# Patient Record
Sex: Female | Born: 1955 | Race: White | Hispanic: No | Marital: Married | Smoking: Never smoker
Health system: Southern US, Community
[De-identification: ages and names within clinical notes are randomized; demographics above are authoritative.]

## PROBLEM LIST (undated history)

## (undated) DIAGNOSIS — J309 Allergic rhinitis, unspecified: Secondary | ICD-10-CM

## (undated) DIAGNOSIS — E559 Vitamin D deficiency, unspecified: Secondary | ICD-10-CM

## (undated) DIAGNOSIS — C801 Malignant (primary) neoplasm, unspecified: Secondary | ICD-10-CM

## (undated) DIAGNOSIS — E119 Type 2 diabetes mellitus without complications: Secondary | ICD-10-CM

## (undated) HISTORY — DX: Allergic rhinitis, unspecified: J30.9

## (undated) HISTORY — DX: Type 2 diabetes mellitus without complications: E11.9

## (undated) HISTORY — DX: Vitamin D deficiency, unspecified: E55.9

---

## 1988-11-18 HISTORY — PX: TUBAL LIGATION: SHX77

## 2002-02-13 ENCOUNTER — Emergency Department (HOSPITAL_COMMUNITY): Admission: EM | Admit: 2002-02-13 | Discharge: 2002-02-13 | Payer: Self-pay | Admitting: Emergency Medicine

## 2004-07-05 ENCOUNTER — Ambulatory Visit (HOSPITAL_COMMUNITY): Admission: RE | Admit: 2004-07-05 | Discharge: 2004-07-05 | Payer: Self-pay | Admitting: Internal Medicine

## 2005-07-08 ENCOUNTER — Ambulatory Visit (HOSPITAL_COMMUNITY): Admission: RE | Admit: 2005-07-08 | Discharge: 2005-07-08 | Payer: Self-pay | Admitting: Obstetrics and Gynecology

## 2006-07-10 ENCOUNTER — Ambulatory Visit (HOSPITAL_COMMUNITY): Admission: RE | Admit: 2006-07-10 | Discharge: 2006-07-10 | Payer: Self-pay | Admitting: Obstetrics and Gynecology

## 2007-07-13 ENCOUNTER — Ambulatory Visit (HOSPITAL_COMMUNITY): Admission: RE | Admit: 2007-07-13 | Discharge: 2007-07-13 | Payer: Self-pay | Admitting: Obstetrics and Gynecology

## 2007-07-28 ENCOUNTER — Other Ambulatory Visit: Admission: RE | Admit: 2007-07-28 | Discharge: 2007-07-28 | Payer: Self-pay | Admitting: Obstetrics and Gynecology

## 2008-09-01 ENCOUNTER — Ambulatory Visit (HOSPITAL_COMMUNITY): Admission: RE | Admit: 2008-09-01 | Discharge: 2008-09-01 | Payer: Self-pay | Admitting: Obstetrics and Gynecology

## 2010-01-11 ENCOUNTER — Ambulatory Visit: Payer: Self-pay | Admitting: Internal Medicine

## 2010-01-11 DIAGNOSIS — R05 Cough: Secondary | ICD-10-CM

## 2010-01-11 DIAGNOSIS — J309 Allergic rhinitis, unspecified: Secondary | ICD-10-CM | POA: Insufficient documentation

## 2010-12-18 NOTE — Assessment & Plan Note (Signed)
Summary: Pulmonary/ new pt eval   Visit Type:  Initial Consult Copy to:  Self Primary Provider/Referring Provider:  Dr Sherwood Gambler  CC:  Cough.  History of Present Illness: 88 yowf never smoker with h/o mild springtime rhinitis with occ sudafed adequate to control and once a year or so get head cold down to chest and need  to go in for ov with zpak or biaxin and occ a shot with complete resolution w/in a week.  Does have chronic drainage issue that 's not that bad and not seasonal but "drives her husband crazy", doesn't really bother her.  January 11, 2010 cc acute onset x 1 week of watery rhinitis assoc  with nasal congestion seen by primary 2/19 and got shot and biaxin and feeling better and back to work 2/22.  Pt denies any significant sore throat, dysphagia, itching, sneezing,  fever, chills, sweats, unintended wt loss, pleuritic or exertional cp, hempoptysis, change in activity tolerance  orthopnea pnd or leg swelling.  Pt also denies any obvious fluctuation in symptoms with weather or environmental change or other alleviating or aggravating factors.         Allergies (verified): 1)  ! Keflex 2)  ! 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  The patient denies shortness of breath with activity, shortness of breath at rest, non-productive cough, coughing up blood, chest pain, irregular heartbeats, acid heartburn, indigestion, loss of appetite, weight change, abdominal pain, difficulty swallowing, sore throat, tooth/dental problems, headaches, itching, ear ache, anxiety, depression, hand/feet swelling, joint stiffness or pain,  rash, and fever.    Vital Signs:  Patient profile:   55 year old female Height:      65 inches Weight:      225.50 pounds BMI:     37.66 O2 Sat:      96 % on Room air Temp:     98.5 degrees F oral Pulse rate:   88 / minute BP sitting:   136 / 72  (left arm) Cuff size:   large  Vitals Entered By: Vernie Murders (January 11, 2010 2:17 PM)  O2 Flow:  Room air  Physical Exam  Additional Exam:  amb wf slt hoarse with occ throat clearing wt 225 January 11, 2010 HEENT: nl dentition, turbinates, and orophanx. Nl external ear canals without cough reflex NECK :  without JVD/Nodes/TM/ nl carotid upstrokes bilaterally LUNGS: no acc muscle use, clear to A and P bilaterally without cough on insp or exp maneuvers CV:  RRR  no s3 or murmur or increase in P2, no edema  ABD:  soft and nontender with nl excursion in the supine position. No bruits or organomegaly, bowel sounds nl MS:  warm without deformities, calf tenderness, cyanosis or clubbing SKIN: warm and dry without lesions   NEURO:  alert, approp, no deficits     CXR  Procedure date:  01/11/2010  Findings:      Comparison: None   Findings: Heart and mediastinal contours are within normal limits. No focal opacities or effusions.  No acute bony abnormality.   IMPRESSION: No active disease.  Impression & Recommendations:  Problem # 1:  COUGH (ICD-786.2) Upper airway  cough syndrome, so named because it's frequently impossible to sort out how much is LPR vs CR/sinusitis with freq throat clearing generating secondary extra esophageal GERD from wide swings in gastric pressure that occur with throat clearing, promoting self use of mint and menthol lozenges that reduce the lower esophageal sphincter tone and exacerbate the problem further.  These symptoms are easily confused with asthma/copd by even experienced pulmonogists because they overlap so much. These are the same pts who not infrequently have failed to tolerate ace inhibitors,   dry powder inhalers or biphosphonates or report having reflux symptoms that don't respond to standard doses of PPI  Explained natural h/o uri and why it's necessary in patients at risk to rx short term with PPI to reduce risk of evolving cyclical cough triggered by epithelial injury and a heightened sensitivty to the effects of any upper airway irritants,  most importantly acid - related    See instructions for specific recommendations   Problem # 2:  ALLERGIC RHINITIS (ICD-477.9) try as needed chlortrimeton   Other Orders: T-2 View CXR (71020TC) New Patient Level IV (04540)  Patient Instructions: 1)  You have different triggers (viral infection and allergies) that make you make clear your throat or cough and that trigger secondary reflux which may make the problem worse 2)  Acid reflux is a leading suspect here and needs to be eliminated  completely before considering additional studies or treatment options. To suppress this maximally, take prilosec Take  one 30-60 min before first meal of the day and pepcid 20 mg (otc) at bedtime plus diet measures as listed.  3)  if still have the urge to cough or clear throat best best chlortrimeton 4 mg one 6 hours as needed 4)  if not 100% satisfied you need to be seen by our allergist Dr Maple Hudson 5)  GERD (REFLUX)  is a common cause of respiratory symptoms. It commonly presents without heartburn and can be treated with medication, but also with lifestyle changes including avoidance of late meals, excessive alcohol, smoking cessation, and avoid fatty foods, chocolate, peppermint, colas, red wine, and acidic juices such as orange juice. NO MINT OR MENTHOL PRODUCTS SO NO COUGH DROPS  6)  USE SUGARLESS CANDY INSTEAD (jolley ranchers)  7)  NO OIL BASED VITAMINS

## 2012-09-23 ENCOUNTER — Ambulatory Visit (INDEPENDENT_AMBULATORY_CARE_PROVIDER_SITE_OTHER): Payer: Managed Care, Other (non HMO) | Admitting: Internal Medicine

## 2012-09-23 ENCOUNTER — Encounter: Payer: Self-pay | Admitting: Internal Medicine

## 2012-09-23 VITALS — BP 114/82 | HR 94 | Temp 97.9°F | Ht 64.0 in | Wt 233.0 lb

## 2012-09-23 DIAGNOSIS — R05 Cough: Secondary | ICD-10-CM

## 2012-09-23 DIAGNOSIS — R059 Cough, unspecified: Secondary | ICD-10-CM

## 2012-09-23 MED ORDER — TRAMADOL HCL 50 MG PO TABS: ORAL_TABLET | ORAL | Status: DC

## 2012-09-23 NOTE — Progress Notes (Signed)
Subjective:     Patient ID: Molly Weeks, female   DOB: 04/30/1956 MRN: [id number]  HPI  55yowf never smoker with h/o mild springtime rhinitis with occ sudafed adequate to control and once a year or so get head cold down to chest and need to go in for ov with zpak or biaxin and occ a shot with complete resolution w/in a week. Does have chronic drainage issue that 's not that bad and not seasonal but "drives her husband crazy", doesn't really bother her.   January 11, 2010 cc acute onset x 1 week of watery rhinitis assoc with nasal congestion seen by primary 2/19 and got shot and biaxin and feeling better and back to work 2/22.  rec 1) You have different triggers (viral infection and allergies) that make you make clear your throat or cough and that trigger secondary reflux which may make the problem worse  2) take prilosec Take one 30-60 min before first meal of the day and pepcid 20 mg (otc) at bedtime plus diet measures as listed.  3) if still have the urge to cough or clear throat best best chlortrimeton 4 mg one 6 hours as needed  4) if not 100% satisfied you need to be seen by our allergist Dr Molly Weeks  5) GERD diet reviewed > did not require f/u   09/23/2012 f/u ov/Molly Weeks cc persistent  throat clearing for year intially indolent onset but much worse since when 3 days prior to OV  > greenish mucus then turned clear seen by Molly Weeks [facility] 11/5 rx zpak and albuterol and shot and feeling better prior to OV.  Previously had not followed the gerd rx rec in 2011.  No obvious daytime variabilty or assoc chronic sob  or cp or chest tightness, subjective wheeze overt sinus or hb symptoms. No unusual exp hx    Sleeping ok without nocturnal  or early am exacerbation  of respiratory  c/o's or need for noct saba. Also denies any obvious fluctuation of symptoms with weather or environmental changes or other aggravating or alleviating factors except as outlined above   ROS  The following are not active complaints  unless bolded sore throat, dysphagia, dental problems, itching, sneezing,  nasal congestion or excess/ purulent secretions, ear ache,   fever, chills, sweats, unintended wt loss, pleuritic or exertional cp, hemoptysis,  orthopnea pnd or leg swelling, presyncope, palpitations, heartburn, abdominal pain, anorexia, nausea, vomiting, diarrhea  or change in bowel or urinary habits, change in stools or urine, dysuria,hematuria,  rash, arthralgias, visual complaints, headache, numbness weakness or ataxia or problems with walking or coordination,  change in mood/affect or memory.         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 Nl external ear canals without cough reflex  NECK : without JVD/Nodes/TM/ nl carotid upstrokes bilaterally  LUNGS: no acc muscle use, clear to A and P bilaterally without cough on insp or exp maneuvers  CV: RRR no s3 or murmur or increase in P2, no edema  ABD: soft and nontender with nl excursion in  the supine position. No bruits or organomegaly, bowel sounds nl  MS: warm without deformities, calf tenderness, cyanosis or clubbing  SKIN: warm and dry without lesions  NEURO: alert, approp, no deficits      Assessment:          Plan:

## 2012-09-23 NOTE — Patient Instructions (Addendum)
Finish the Zpak and only use albuterol if short of breath  Take delsym two tsp every 12 hours and supplement if needed with  tramadol 50 mg up to 2 every 4 hours to suppress the urge to cough. Swallowing water or using ice chips/non mint and menthol containing candies (such as lifesavers or sugarless jolly ranchers) are also effective.  You should rest your voice and avoid activities that you know make you cough.  Once you have eliminated the cough for 3 straight days try reducing the tramadol first,  then the delsym as tolerated.    Try prilosec 20mg   Take 30-60 min before first meal of the day and Pepcid 20 mg one bedtime until cough is completely gone for at least a week without the need for cough suppression  GERD (REFLUX)  is an extremely common cause of respiratory symptoms like yours, many times with no significant heartburn at all.    It can be treated with medication, but also with lifestyle changes including avoidance of late meals, excessive alcohol, smoking cessation, and avoid fatty foods, chocolate, peppermint, colas, red wine, and acidic juices such as orange juice.  NO MINT OR MENTHOL PRODUCTS SO NO COUGH DROPS  USE SUGARLESS CANDY INSTEAD (jolley ranchers or Stover's)  NO OIL BASED VITAMINS - use powdered substitutes.  If not improving next steps are sinus ct and referral to allergist  But you will need to return here to set these

## 2012-09-27 NOTE — Assessment & Plan Note (Addendum)
The most common causes of chronic cough in immunocompetent adults include the following: upper airway cough syndrome (UACS), previously referred to as postnasal drip syndrome (PNDS), which is caused by variety of rhinosinus conditions; (2) asthma; (3) GERD; (4) chronic bronchitis from cigarette smoking or other inhaled environmental irritants; (5) nonasthmatic eosinophilic bronchitis; and (6) bronchiectasis.   These conditions, singly or in combination, have accounted for up to 94% of the causes of chronic cough in prospective studies.   Other conditions have constituted no >6% of the causes in prospective studies These have included bronchogenic carcinoma, chronic interstitial pneumonia, sarcoidosis, left ventricular failure, ACEI-induced cough, and aspiration from a condition associated with pharyngeal dysfunction.   Most likely this is  Classic Upper airway cough syndrome, so named because it's frequently impossible to sort out how much is  CR/sinusitis with freq throat clearing (which can be related to primary GERD)   vs  causing  secondary (" extra esophageal")  GERD from wide swings in gastric pressure that occur with throat clearing, often  promoting self use of mint and menthol lozenges that reduce the lower esophageal sphincter tone and exacerbate the problem further in a cyclical fashion.   These are the same pts (now being labeled as having "irritable larynx syndrome" by some cough centers) who not infrequently have a history of having failed to tolerate ace inhibitors,  dry powder inhalers or biphosphonates or report having atypical reflux symptoms that don't respond to standard doses of PPI , and are easily confused as having aecopd or asthma flares by even experienced allergists/ pulmonologists.   Explained natural history of uri and why it's necessary in patients at risk to treat GERD aggressively  at least  short term   to reduce risk of evolving cyclical cough initially  triggered by  epithelial injury and a heightened sensitivty to the effects of any upper airway irritants,  most importantly acid - related.  That is, the more sensitive the epithelium damaged for virus, the more the cough, the more the secondary reflux (especially in those prone to reflux) the more the irritation of the sensitive mucosa and so on in a cyclical pattern.   If not satisfied needs to  Allergy evaluation next.

## 2013-12-23 ENCOUNTER — Other Ambulatory Visit: Payer: Self-pay | Admitting: Obstetrics and Gynecology

## 2013-12-23 DIAGNOSIS — Z139 Encounter for screening, unspecified: Secondary | ICD-10-CM

## 2013-12-27 ENCOUNTER — Ambulatory Visit (HOSPITAL_COMMUNITY)
Admission: RE | Admit: 2013-12-27 | Discharge: 2013-12-27 | Disposition: A | Discharge status: Home / Self Care | Payer: Managed Care, Other (non HMO) | Source: Ambulatory Visit | Attending: Obstetrics and Gynecology | Admitting: Obstetrics and Gynecology

## 2013-12-27 DIAGNOSIS — Z1231 Encounter for screening mammogram for malignant neoplasm of breast: Secondary | ICD-10-CM | POA: Insufficient documentation

## 2013-12-27 DIAGNOSIS — Z139 Encounter for screening, unspecified: Secondary | ICD-10-CM

## 2014-12-14 ENCOUNTER — Other Ambulatory Visit: Payer: Self-pay | Admitting: Obstetrics and Gynecology

## 2014-12-14 DIAGNOSIS — Z1231 Encounter for screening mammogram for malignant neoplasm of breast: Secondary | ICD-10-CM

## 2014-12-30 ENCOUNTER — Ambulatory Visit (HOSPITAL_COMMUNITY)
Admission: RE | Admit: 2014-12-30 | Discharge: 2014-12-30 | Disposition: A | Discharge status: Home / Self Care | Payer: Managed Care, Other (non HMO) | Source: Ambulatory Visit | Attending: Obstetrics and Gynecology | Admitting: Obstetrics and Gynecology

## 2014-12-30 DIAGNOSIS — Z1231 Encounter for screening mammogram for malignant neoplasm of breast: Secondary | ICD-10-CM

## 2015-12-05 ENCOUNTER — Other Ambulatory Visit: Payer: Self-pay | Admitting: Obstetrics and Gynecology

## 2015-12-05 DIAGNOSIS — Z1231 Encounter for screening mammogram for malignant neoplasm of breast: Secondary | ICD-10-CM

## 2016-01-01 ENCOUNTER — Ambulatory Visit (HOSPITAL_COMMUNITY)
Admission: RE | Admit: 2016-01-01 | Discharge: 2016-01-01 | Disposition: A | Discharge status: Home / Self Care | Payer: 59 | Source: Ambulatory Visit | Attending: Obstetrics and Gynecology | Admitting: Obstetrics and Gynecology

## 2016-01-01 DIAGNOSIS — Z1231 Encounter for screening mammogram for malignant neoplasm of breast: Secondary | ICD-10-CM | POA: Insufficient documentation

## 2016-11-29 ENCOUNTER — Other Ambulatory Visit: Payer: Self-pay | Admitting: Adult Health

## 2016-12-09 ENCOUNTER — Other Ambulatory Visit: Payer: Self-pay | Admitting: Obstetrics and Gynecology

## 2016-12-09 DIAGNOSIS — Z1231 Encounter for screening mammogram for malignant neoplasm of breast: Secondary | ICD-10-CM

## 2016-12-13 ENCOUNTER — Other Ambulatory Visit: Payer: Self-pay | Admitting: Adult Health

## 2017-01-01 ENCOUNTER — Emergency Department (HOSPITAL_COMMUNITY): Payer: 59

## 2017-01-01 ENCOUNTER — Emergency Department (HOSPITAL_COMMUNITY)
Admission: EM | Admit: 2017-01-01 | Discharge: 2017-01-01 | Disposition: A | Discharge status: Home / Self Care | Payer: 59 | Attending: Emergency Medicine | Admitting: Emergency Medicine

## 2017-01-01 ENCOUNTER — Encounter (HOSPITAL_COMMUNITY): Payer: Self-pay | Admitting: Emergency Medicine

## 2017-01-01 DIAGNOSIS — R05 Cough: Secondary | ICD-10-CM | POA: Diagnosis not present

## 2017-01-01 DIAGNOSIS — R509 Fever, unspecified: Secondary | ICD-10-CM | POA: Diagnosis present

## 2017-01-01 DIAGNOSIS — J111 Influenza due to unidentified influenza virus with other respiratory manifestations: Secondary | ICD-10-CM

## 2017-01-01 DIAGNOSIS — R69 Illness, unspecified: Secondary | ICD-10-CM

## 2017-01-01 LAB — BASIC METABOLIC PANEL
Anion gap: 8 (ref 5–15)
BUN: 13 mg/dL (ref 6–20)
CHLORIDE: 105 mmol/L (ref 101–111)
CO2: 25 mmol/L (ref 22–32)
CREATININE: 0.7 mg/dL (ref 0.44–1.00)
Calcium: 8.6 mg/dL — ABNORMAL LOW (ref 8.9–10.3)
GFR calc Af Amer: >60 mL/min (ref >60)
GFR calc non Af Amer: >60 mL/min (ref >60)
Glucose, Bld: 130 mg/dL — ABNORMAL HIGH (ref 65–99)
Potassium: 3.8 mmol/L (ref 3.5–5.1)
Sodium: 138 mmol/L (ref 135–145)

## 2017-01-01 LAB — CBC
HEMATOCRIT: 39.1 % (ref 36.0–46.0)
Hemoglobin: 13.5 g/dL (ref 12.0–15.0)
MCH: 31.9 pg (ref 26.0–34.0)
MCHC: 34.5 g/dL (ref 30.0–36.0)
MCV: 92.4 fL (ref 78.0–100.0)
Platelets: 247 10*3/uL (ref 150–400)
RBC: 4.23 MIL/uL (ref 3.87–5.11)
RDW: 12.2 % (ref 11.5–15.5)
WBC: 13.9 10*3/uL — ABNORMAL HIGH (ref 4.0–10.5)

## 2017-01-01 MED ORDER — OSELTAMIVIR PHOSPHATE 75 MG PO CAPS
75.0000 mg | ORAL_CAPSULE | Freq: Once | ORAL | Status: AC
  Administered 2017-01-01: 75 mg via ORAL
  Filled 2017-01-01: qty 1

## 2017-01-01 MED ORDER — OSELTAMIVIR PHOSPHATE 75 MG PO CAPS: 75.0000 mg | ORAL_CAPSULE | Freq: Two times a day (BID) | ORAL | 0 refills | Status: DC

## 2017-01-01 MED ORDER — ACETAMINOPHEN 325 MG PO TABS
650.0000 mg | ORAL_TABLET | Freq: Once | ORAL | Status: AC | PRN
  Administered 2017-01-01: 650 mg via ORAL
  Filled 2017-01-01: qty 2

## 2017-01-01 NOTE — Discharge Instructions (Signed)
You can consider taking the tamiflu for your influenza like illness.  Take tylenol or advil as needed for fever

## 2017-01-01 NOTE — ED Provider Notes (Signed)
[facility] DEPT Provider Note   CSN: [id number] Arrival date & time: 01/01/17  [place]     History   Chief Complaint Chief Complaint  Patient presents with  . Fever    HPI Molly Weeks is a 61 y.o. female.  HPI Patient presents to the emergency room with complaints of cough and fever that started yesterday. She has had cough productive of white mucus. She developed a fever today up to 102. She went into an urgent care this morning and was diagnosed with an upper respiratory infection. At the urgent care she had a negative flu test. At home patient started becoming tachycardic and she spiked the fever. Husband checked her pulse and it was over 100. He became concerned and brought her into the emergency room. Patient denies any vomiting or diarrhea. No chest pain or shortness of breath. Past Medical History:  Diagnosis Date  . Allergic rhinitis     Patient Active Problem List   Diagnosis Date Noted  . ALLERGIC RHINITIS 01/11/2010  . Cough 01/11/2010    Past Surgical History:  Procedure Laterality Date  . TUBAL LIGATION  1990    OB History    No data available       Home Medications    Prior to Admission medications   Medication Sig Start Date End Date Taking? Authorizing Provider  HYDROcodone-homatropine (HYCODAN) 5-1.5 MG/5ML syrup Take 5 mLs by mouth every 6 (six) hours as needed for cough.  01/01/17   Historical Provider, MD  oseltamivir (TAMIFLU) 75 MG capsule Take 1 capsule (75 mg total) by mouth 2 (two) times daily. 01/01/17   Dorie Rank, MD    Family History Family History  Problem Relation Age of Onset  . Asthma      multiple relatives  . Allergies      multiple relatives    Social History Social History  Substance Use Topics  . Smoking status: Never Smoker  . Smokeless tobacco: Not on file  . Alcohol use No     Allergies   Cephalexin and Moxifloxacin   Review of Systems Review of Systems  All other systems reviewed and are  negative.    Physical Exam Updated Vital Signs BP 120/72 (BP Location: Right Arm)   Pulse 96   Temp 99.9 F (37.7 C) (Oral)   Resp 18   Ht 5\' 5"  (1.651 m)   Wt 100.7 kg   SpO2 96%   BMI 36.94 kg/m   Physical Exam  Constitutional: She appears well-developed and well-nourished. No distress.  HENT:  Head: Normocephalic and atraumatic.  Right Ear: External ear normal.  Left Ear: External ear normal.  Eyes: Conjunctivae are normal. Right eye exhibits no discharge. Left eye exhibits no discharge. No scleral icterus.  Neck: Neck supple. No tracheal deviation present.  Cardiovascular: Normal rate and intact distal pulses.   Mild tachycardia  Pulmonary/Chest: Effort normal and breath sounds normal. No stridor. No respiratory distress. She has no wheezes. She has no rales.  Abdominal: Soft. Bowel sounds are normal. She exhibits no distension. There is no tenderness. There is no rebound and no guarding.  Musculoskeletal: She exhibits no edema or tenderness.  Neurological: She is alert. She has normal strength. No cranial nerve deficit (no facial droop, extraocular movements intact, no slurred speech) or sensory deficit. She exhibits normal muscle tone. She displays no seizure activity. Coordination normal.  Skin: Skin is warm and dry. No rash noted.  Psychiatric: She has a normal mood and affect.  Nursing note and vitals reviewed.    ED Treatments / Results  Labs (all labs ordered are listed, but only abnormal results are displayed) Labs Reviewed  CBC - Abnormal; Notable for the following:       Result Value   WBC 13.9 (*)    All other components within normal limits  BASIC METABOLIC PANEL - Abnormal; Notable for the following:    Glucose, Bld 130 (*)    Calcium 8.6 (*)    All other components within normal limits    EKG  EKG Interpretation None       Radiology Dg Chest 2 View  Result Date: 01/01/2017 CLINICAL DATA:  Cough EXAM: CHEST  2 VIEW COMPARISON:  01/11/2010  FINDINGS: The heart size and mediastinal contours are within normal limits. Both lungs are clear. The visualized skeletal structures are unremarkable. IMPRESSION: No active cardiopulmonary disease. Electronically Signed   By: Franchot Gallo M.D.   On: 01/01/2017 19:15    Procedures Procedures (including critical care time)  Medications Ordered in ED Medications  acetaminophen (TYLENOL) tablet 650 mg (650 mg Oral Given 01/01/17 1847)  oseltamivir (TAMIFLU) capsule 75 mg (75 mg Oral Given 01/01/17 2200)     Initial Impression / Assessment and Plan / ED Course  I have reviewed the triage vital signs and the nursing notes.  Pertinent labs & imaging results that were available during my care of the patient were reviewed by me and considered in my medical decision making (see chart for details).  Clinical Course as of Jan 02 1733  Wed Jan 01, 2017  2057 Fever decreased with treatment.  HR also decreased.  Labs show elevated WBC but otherwise normal.  Suspect influenza like illness. Pt is concerned about the possibility of influenza  [JK]    Clinical Course User Index [JK] Dorie Rank, MD    Pt appears well.  Family and pt are concerned about influenza and are requesting tamiflu.  Clinically sx are consistent with influenxza despite negative outpatient test suspect influenza.  Discussed risks and benefits of tamiflu.  Pt would prefer an RX  Final Clinical Impressions(s) / ED Diagnoses   Final diagnoses:  Influenza-like illness    New Prescriptions Discharge Medication List as of 01/01/2017  9:42 PM    START taking these medications   Details  oseltamivir (TAMIFLU) 75 MG capsule Take 1 capsule (75 mg total) by mouth 2 (two) times daily., Starting Wed 01/01/2017, Print         Dorie Rank, MD 01/02/17 [id number]

## 2017-01-01 NOTE — ED Triage Notes (Addendum)
Pt c/o cough and fever since yesterday. Pt reports cough is productive with white mucus. Pt seen at urgent care this am and dx with URI. Pt reports negative for flu at urgent care.

## 2017-01-03 ENCOUNTER — Other Ambulatory Visit: Payer: Self-pay | Admitting: Adult Health

## 2017-01-03 ENCOUNTER — Ambulatory Visit (HOSPITAL_COMMUNITY): Payer: 59

## 2017-01-06 ENCOUNTER — Ambulatory Visit (HOSPITAL_COMMUNITY): Payer: 59

## 2017-02-03 ENCOUNTER — Ambulatory Visit (HOSPITAL_COMMUNITY): Payer: 59

## 2017-03-17 ENCOUNTER — Ambulatory Visit (HOSPITAL_COMMUNITY)
Admission: RE | Admit: 2017-03-17 | Discharge: 2017-03-17 | Disposition: A | Discharge status: Home / Self Care | Payer: 59 | Source: Ambulatory Visit | Attending: Obstetrics and Gynecology | Admitting: Obstetrics and Gynecology

## 2017-03-17 DIAGNOSIS — Z1231 Encounter for screening mammogram for malignant neoplasm of breast: Secondary | ICD-10-CM | POA: Insufficient documentation

## 2017-04-01 ENCOUNTER — Encounter: Payer: Self-pay | Admitting: Adult Health

## 2017-04-01 ENCOUNTER — Ambulatory Visit (INDEPENDENT_AMBULATORY_CARE_PROVIDER_SITE_OTHER): Payer: 59 | Admitting: Adult Health

## 2017-04-01 ENCOUNTER — Other Ambulatory Visit (HOSPITAL_COMMUNITY)
Admission: RE | Admit: 2017-04-01 | Discharge: 2017-04-01 | Disposition: A | Discharge status: Home / Self Care | Payer: 59 | Source: Ambulatory Visit | Attending: Adult Health | Admitting: Adult Health

## 2017-04-01 VITALS — BP 138/86 | HR 62 | Ht 64.5 in | Wt 225.0 lb

## 2017-04-01 DIAGNOSIS — Z114 Encounter for screening for human immunodeficiency virus [HIV]: Secondary | ICD-10-CM

## 2017-04-01 DIAGNOSIS — Z1211 Encounter for screening for malignant neoplasm of colon: Secondary | ICD-10-CM | POA: Diagnosis not present

## 2017-04-01 DIAGNOSIS — Z13220 Encounter for screening for lipoid disorders: Secondary | ICD-10-CM

## 2017-04-01 DIAGNOSIS — Z01419 Encounter for gynecological examination (general) (routine) without abnormal findings: Secondary | ICD-10-CM

## 2017-04-01 DIAGNOSIS — Z1159 Encounter for screening for other viral diseases: Secondary | ICD-10-CM

## 2017-04-01 DIAGNOSIS — Z1329 Encounter for screening for other suspected endocrine disorder: Secondary | ICD-10-CM

## 2017-04-01 DIAGNOSIS — Z1321 Encounter for screening for nutritional disorder: Secondary | ICD-10-CM

## 2017-04-01 DIAGNOSIS — Z1212 Encounter for screening for malignant neoplasm of rectum: Secondary | ICD-10-CM

## 2017-04-01 DIAGNOSIS — Z131 Encounter for screening for diabetes mellitus: Secondary | ICD-10-CM

## 2017-04-01 LAB — HEMOCCULT GUIAC POC 1CARD (OFFICE): Fecal Occult Blood, POC: NEGATIVE

## 2017-04-01 NOTE — Progress Notes (Signed)
Patient ID: Molly Weeks, female   DOB: 03/09/1956, 61 y.o.   MRN: [id number] History of Present Illness:  Molly Weeks is a 61 year old white female, married, G1P1, she is PM and  in for well woman gyn exam and pap, she says it has  been years since last pap.She still works in Marketing executive at [employer].Her goa; for this year is to take better care of herself.  PCP is Dr [name].   Current Medications, Allergies, Past Medical History, Past Surgical History, Family History and Social History were reviewed in [facility] record.     Review of Systems:  Patient denies any headaches, hearing loss, fatigue, blurred vision, shortness of breath, chest pain, abdominal pain, problems with bowel movements, urination, or intercourse(not currently having sex, husband has health issues). No joint pain or mood swings.   Physical Exam:BP 138/86 (BP Location: Left Arm, Patient Position: Sitting, Cuff Size: Normal)   Pulse 62   Ht 5' 4.5" (1.638 m)   Wt 225 lb (102.1 kg)   BMI 38.02 kg/m  General:  Well developed, well nourished, no acute distress Skin:  Warm and dry Neck:  Midline trachea, normal thyroid, good ROM, no lymphadenopathy Lungs; Clear to auscultation bilaterally Breast:  No dominant palpable mass, retraction, or nipple discharge Cardiovascular: Regular rate and rhythm Abdomen:  Soft, non tender, no hepatosplenomegaly Pelvic:  External genitalia is normal in appearance, no lesions. Has skin tags inner thigh. The vagina is normal in appearance. Urethra has no lesions or masses. The cervix is smooth, pap with HPV performed.  Uterus is felt to be normal size, shape, and contour.  No adnexal masses or tenderness noted.Bladder is non tender, no masses felt. Rectal: Good sphincter tone, no polyps, or hemorrhoids felt.  Hemoccult negative. Extremities/musculoskeletal:  No swelling or varicosities noted, no clubbing or cyanosis Psych:  No mood changes, alert and cooperative,seems  happy PHQ 2 score 0.  Impression: 1. Pap smear, as part of routine gynecological examination   2. Screening for colorectal cancer   3. Screening cholesterol level   4. Screening for thyroid disorder   5. Screening for diabetes mellitus   6. Screening for HIV without presence of risk factors   7. Encounter for vitamin deficiency screening   8. Encounter for hepatitis C screening test for low risk patient       Plan: Check CBC,CMP,TSH and lipids,HIV, Hept C antibody, A1c and vitamin D Physical in 1 year, pap  in 3 if normal Mammogram yearly(normal 03/17/17) Colonoscopy advised and referral sent to Dr Gala Romney

## 2017-04-02 LAB — COMPREHENSIVE METABOLIC PANEL
A/G RATIO: 1.8 (ref 1.2–2.2)
ALBUMIN: 4.1 g/dL (ref 3.5–5.5)
ALT: 34 IU/L — AB (ref 0–32)
AST: 28 IU/L (ref 0–40)
Alkaline Phosphatase: 98 IU/L (ref 39–117)
BILIRUBIN TOTAL: 0.3 mg/dL (ref 0.0–1.2)
BUN/Creatinine Ratio: 20 (ref 9–23)
BUN: 12 mg/dL (ref 6–24)
CO2: 24 mmol/L (ref 18–29)
CREATININE: 0.59 mg/dL (ref 0.57–1.00)
Calcium: 9.1 mg/dL (ref 8.7–10.2)
Chloride: 102 mmol/L (ref 96–106)
GFR, EST AFRICAN AMERICAN: 117 mL/min/{1.73_m2} (ref >59)
GFR, EST NON AFRICAN AMERICAN: 101 mL/min/{1.73_m2} (ref >59)
GLOBULIN, TOTAL: 2.3 g/dL (ref 1.5–4.5)
Glucose: 131 mg/dL — ABNORMAL HIGH (ref 65–99)
POTASSIUM: 4.9 mmol/L (ref 3.5–5.2)
Sodium: 141 mmol/L (ref 134–144)
TOTAL PROTEIN: 6.4 g/dL (ref 6.0–8.5)

## 2017-04-02 LAB — CYTOLOGY - PAP
Diagnosis: NEGATIVE
HPV (WINDOPATH): NOT DETECTED

## 2017-04-02 LAB — LIPID PANEL
CHOL/HDL RATIO: 4.6 ratio — AB (ref 0.0–4.4)
CHOLESTEROL TOTAL: 209 mg/dL — AB (ref 100–199)
HDL: 45 mg/dL (ref >39)
LDL CALC: 135 mg/dL — AB (ref 0–99)
TRIGLYCERIDES: 145 mg/dL (ref 0–149)
VLDL Cholesterol Cal: 29 mg/dL (ref 5–40)

## 2017-04-02 LAB — CBC
HEMATOCRIT: 41 % (ref 34.0–46.6)
HEMOGLOBIN: 13.4 g/dL (ref 11.1–15.9)
MCH: 30.7 pg (ref 26.6–33.0)
MCHC: 32.7 g/dL (ref 31.5–35.7)
MCV: 94 fL (ref 79–97)
Platelets: 280 10*3/uL (ref 150–379)
RBC: 4.37 x10E6/uL (ref 3.77–5.28)
RDW: 13 % (ref 12.3–15.4)
WBC: 7.9 10*3/uL (ref 3.4–10.8)

## 2017-04-02 LAB — VITAMIN D 25 HYDROXY (VIT D DEFICIENCY, FRACTURES): Vit D, 25-Hydroxy: 13.1 ng/mL — ABNORMAL LOW (ref 30.0–100.0)

## 2017-04-02 LAB — TSH: TSH: 3.06 u[IU]/mL (ref 0.450–4.500)

## 2017-04-02 LAB — HEPATITIS C ANTIBODY: Hep C Virus Ab: <0.1 s/co ratio (ref 0.0–0.9)

## 2017-04-02 LAB — HEMOGLOBIN A1C
ESTIMATED AVERAGE GLUCOSE: 151 mg/dL
Hgb A1c MFr Bld: 6.9 % — ABNORMAL HIGH (ref 4.8–5.6)

## 2017-04-02 LAB — HIV ANTIBODY (ROUTINE TESTING W REFLEX): HIV SCREEN 4TH GENERATION: NONREACTIVE

## 2017-04-08 ENCOUNTER — Telehealth: Payer: Self-pay | Admitting: Adult Health

## 2017-04-08 ENCOUNTER — Encounter: Payer: Self-pay | Admitting: Adult Health

## 2017-04-08 DIAGNOSIS — E559 Vitamin D deficiency, unspecified: Secondary | ICD-10-CM | POA: Insufficient documentation

## 2017-04-08 DIAGNOSIS — E119 Type 2 diabetes mellitus without complications: Secondary | ICD-10-CM

## 2017-04-08 HISTORY — DX: Type 2 diabetes mellitus without complications: E11.9

## 2017-04-08 HISTORY — DX: Vitamin D deficiency, unspecified: E55.9

## 2017-04-08 NOTE — Telephone Encounter (Signed)
Pt aware of labs, referred to [facility], RD,CDE, for new diagnosis of diabetes, will try diet and exercise, and F/U with PCP for meds.Take vitam in D 5000 IU daily  Pt aware that pap negative for malignancy and HPV.

## 2017-06-09 ENCOUNTER — Encounter: Payer: 59 | Attending: Internal Medicine | Admitting: Nutrition

## 2017-06-09 ENCOUNTER — Encounter: Payer: Self-pay | Admitting: Nutrition

## 2017-06-09 VITALS — Ht 64.0 in | Wt 213.0 lb

## 2017-06-09 DIAGNOSIS — E119 Type 2 diabetes mellitus without complications: Secondary | ICD-10-CM | POA: Insufficient documentation

## 2017-06-09 DIAGNOSIS — IMO0002 Reserved for concepts with insufficient information to code with codable children: Secondary | ICD-10-CM

## 2017-06-09 DIAGNOSIS — E118 Type 2 diabetes mellitus with unspecified complications: Secondary | ICD-10-CM

## 2017-06-09 DIAGNOSIS — Z713 Dietary counseling and surveillance: Secondary | ICD-10-CM | POA: Insufficient documentation

## 2017-06-09 DIAGNOSIS — E669 Obesity, unspecified: Secondary | ICD-10-CM

## 2017-06-09 DIAGNOSIS — E1165 Type 2 diabetes mellitus with hyperglycemia: Secondary | ICD-10-CM

## 2017-06-09 NOTE — Patient Instructions (Signed)
Goals 1 Follow My Plate 2. Eat three meals on time  2-3 carb choices per meal 3. Increase fresh fruits and vegetables. 4. Drink water only 5. Cut out snacks unless veggies or nuts 6. Don't skip meals Lose 1 lb per week Get A1C 5.7% or less

## 2017-06-12 NOTE — Progress Notes (Signed)
Diabetes Self-Management Education  Visit Type: First/Initial  Appt. Start Time: 1600 Appt. End Time:1700 06/09/18   [name], identified by name and date of birth, is a 61 y.o. female with a diagnosis of Diabetes: Type 2. Just diagnosed from OBGYN. Wants to change eating habits, exercise, lose weight and control her DM with diet and exercise. Lab Results  Component Value Date   HGBA1C 6.9 (H) 04/01/2017     ASSESSMENT  Height 5\' 4"  (1.626 m), weight 213 lb (96.6 kg). Body mass index is 36.56 kg/m.      Diabetes Self-Management Education - 06/09/17 1555      Visit Information   Visit Type First/Initial     Initial Visit   Diabetes Type Type 2   Are you currently following a meal plan? No   Are you taking your medications as prescribed? Not on Medications   Date Diagnosed July 2018     Health Coping   How would you rate your overall health? Good     Psychosocial Assessment   Patient Belief/Attitude about Diabetes Motivated to manage diabetes   Self-care barriers None   Self-management support Doctor's office;Family   Other persons present Patient   Patient Concerns Nutrition/Meal planning;Healthy Lifestyle;Weight Control   Special Needs None   Preferred Learning Style Visual   Learning Readiness Change in progress   How often do you need to have someone help you when you read instructions, pamphlets, or other written materials from your doctor or pharmacy? 1 - Never   What is the last grade level you completed in school? 16     Pre-Education Assessment   Patient understands the diabetes disease and treatment process. Needs Instruction   Patient understands incorporating nutritional management into lifestyle. Needs Instruction   Patient undertands incorporating physical activity into lifestyle. Needs Instruction   Patient understands using medications safely. Needs Instruction   Patient understands monitoring blood glucose, interpreting and using results  Needs Instruction   Patient understands prevention, detection, and treatment of acute complications. Needs Instruction   Patient understands prevention, detection, and treatment of chronic complications. Needs Instruction   Patient understands how to develop strategies to address psychosocial issues. Needs Instruction   Patient understands how to develop strategies to promote health/change behavior. Needs Instruction     Complications   Last HgB A1C per patient/outside source 6.9 %   How often do you check your blood sugar? 0 times/day (not testing)   Have you had a dilated eye exam in the past 12 months? Yes   Have you had a dental exam in the past 12 months? Yes   Are you checking your feet? Yes   How many days per week are you checking your feet? 7     Dietary Intake   Breakfast Oatmeal with banana and rasins, water   Lunch 2 v8 juice, OR  toss salad with ff ranch dressing, wwater, chicken,    Snack (afternoon) yogurt   Programmer, multimedia [place] and toss salad with FF dressing,     Beverage(s) water     Exercise   Exercise Type ADL's      Individualized Plan for Diabetes Self-Management Training:   Learning Objective:  Patient will have a greater understanding of diabetes self-management. Patient education plan is to attend individual and/or group sessions per assessed needs and concerns.   Plan:   Patient Instructions  Goals 1 Follow My Plate 2. Eat three meals on time  2-3 carb choices per meal 3.  Increase fresh fruits and vegetables. 4. Drink water only 5. Cut out snacks unless veggies or nuts 6. Don't skip meals Lose 1 lb per week Get A1C 5.7% or less    Expected Outcomes:     Education material provided: Living Well with Diabetes, Food label handouts, A1C conversion sheet, Meal plan card, My Plate and Carbohydrate counting sheet  If problems or questions, patient to contact team via:  Phone and Email  Future DSME appointment:   1 month

## 2017-07-10 ENCOUNTER — Encounter: Payer: 59 | Attending: Internal Medicine | Admitting: Nutrition

## 2017-07-10 VITALS — Ht 64.0 in | Wt 205.6 lb

## 2017-07-10 DIAGNOSIS — E669 Obesity, unspecified: Secondary | ICD-10-CM

## 2017-07-10 DIAGNOSIS — E119 Type 2 diabetes mellitus without complications: Secondary | ICD-10-CM | POA: Diagnosis present

## 2017-07-10 DIAGNOSIS — Z713 Dietary counseling and surveillance: Secondary | ICD-10-CM | POA: Insufficient documentation

## 2017-07-10 NOTE — Patient Instructions (Signed)
Goals 1. Work on losing 2 lbs per month 2. Increase exercise routine and walk more during day. 3. Keep drinking water Get A1C down to 6% or less. Continue high fiber foods

## 2017-07-10 NOTE — Progress Notes (Signed)
Diabetes Self-Management Education  Visit Type:    Appt. Start Time: 1600 Appt. End Time:1630 06/09/18   [name], identified by name and date of birth, is a 61 y.o. female with a diagnosis of Diabetes:  Marland Kitchen Type 2. Lost 8 lbs since last visit 1 month ago. Lost 25 lbs in the last 3 months. She is now eating better balanced meals. She is following her meal plan and using her carb card. She has been walking 30 minutes a day.   Has been getting in a routine for meals much better.  FBS 100-102 mg/dl.  Eating a lot more fresh fruits and vegetables. She will see Dr. [name] in the next month and get her labs done to evaluate progress. She is not on any DM meds. Has made excellent progress with her eating habits and lifestyle.  Lab Results  Component Value Date   HGBA1C 6.9 (H) 04/01/2017   ASSESSMENT  Height 5\' 4"  (1.626 m), weight 205 lb 9.6 oz (93.3 kg). Body mass index is 35.29 kg/m.   B) Oatmeal, raisin or banana, cottage cheese,water L) chicken salad, 6 crackers, fruits, water D) Grilled Weeks, baked potato and toss salad with FF Dressing. Drinking water   Individualized Plan for Diabetes Self-Management Training:   Learning Objective:  Patient will have a greater understanding of diabetes self-management. Patient education plan is to attend individual and/or group sessions per assessed needs and concerns.   Plan:  Goals 1. Work on losing 2 lbs per month 2. Increase exercise routine and walk more during day. 3. Keep drinking water Get A1C down to 6% or less. Continue high fiber foods Expected Outcomes:   Further weight loss and lower blood sugars and increased self management of her diabetes.  Education material provided: Living Well with Diabetes, Food label handouts, A1C conversion sheet, Meal plan card, My Plate and Carbohydrate counting sheet  If problems or questions, patient to contact team via:  Phone and Email  Future DSME appointment:   3 months.

## 2017-10-13 ENCOUNTER — Encounter: Payer: 59 | Attending: Internal Medicine | Admitting: Nutrition

## 2017-10-13 ENCOUNTER — Encounter: Payer: Self-pay | Admitting: Nutrition

## 2017-10-13 VITALS — Ht 64.0 in | Wt 195.0 lb

## 2017-10-13 DIAGNOSIS — Z713 Dietary counseling and surveillance: Secondary | ICD-10-CM | POA: Diagnosis not present

## 2017-10-13 DIAGNOSIS — E669 Obesity, unspecified: Secondary | ICD-10-CM

## 2017-10-13 DIAGNOSIS — E119 Type 2 diabetes mellitus without complications: Secondary | ICD-10-CM | POA: Insufficient documentation

## 2017-10-13 NOTE — Patient Instructions (Signed)
Goals Keep up the great job!! Increase exercise as tolerated. Lose 2-3 lbs per month

## 2017-10-13 NOTE — Progress Notes (Signed)
Diabetes Self-Management Education  Visit Type:    Follow up  Appt. Start Time: 1600 Appt. End Time:1630 10/13/17  [name], identified by name and date of birth, is a 61 y.o. female with a diagnosis of Diabetes:  Marland Kitchen Type 2. Lost 10 lbs since last visit 3 months ago. Has lost 30 lbs in the last 6 months. Not taking any DM meds and no longer testing blood sugars. Not on any medications for anything.  Starting to get back to walking.  She admits to being more mindful about what she is eating and avoiding snacks.  A1C 10/18 from Dr. [name] office was 5.2%.  ASSESSMENT  Height 5\' 4"  (1.626 m), weight 195 lb (88.5 kg). Body mass index is 33.47 kg/m.   B) Oatmeal, raisin or banana, cottage cheese,water L) chicken salad, 6 crackers, fruits, water D) Grilled Weeks, baked potato and toss salad with FF Dressing. Drinking water   Individualized Plan for Diabetes Self-Management Training:   Learning Objective:  Patient will have a greater understanding of diabetes self-management. Patient education plan is to attend individual and/or group sessions per assessed needs and concerns.    Goals Keep up the great job!! Increase exercise as tolerated. Lose 2-3 lbs per month  Expected Outcomes:   Further weight loss and lower blood sugars and increased self management of her diabetes.  Education material provided: Living Well with Diabetes, Food label handouts, A1C conversion sheet, Meal plan card, My Plate and Carbohydrate counting sheet  If problems or questions, patient to contact team via:  Phone and Email  Future DSME appointment:   1 year.

## 2018-01-29 ENCOUNTER — Emergency Department (INDEPENDENT_AMBULATORY_CARE_PROVIDER_SITE_OTHER)
Admission: EM | Admit: 2018-01-29 | Discharge: 2018-01-29 | Disposition: A | Discharge status: Home / Self Care | Payer: 59 | Source: Home / Self Care | Attending: Emergency Medicine | Admitting: Emergency Medicine

## 2018-01-29 ENCOUNTER — Other Ambulatory Visit: Payer: Self-pay

## 2018-01-29 ENCOUNTER — Encounter: Payer: Self-pay | Admitting: *Deleted

## 2018-01-29 ENCOUNTER — Emergency Department (INDEPENDENT_AMBULATORY_CARE_PROVIDER_SITE_OTHER): Payer: 59

## 2018-01-29 DIAGNOSIS — R05 Cough: Secondary | ICD-10-CM

## 2018-01-29 DIAGNOSIS — J209 Acute bronchitis, unspecified: Secondary | ICD-10-CM

## 2018-01-29 DIAGNOSIS — E860 Dehydration: Secondary | ICD-10-CM

## 2018-01-29 DIAGNOSIS — R509 Fever, unspecified: Secondary | ICD-10-CM | POA: Diagnosis not present

## 2018-01-29 DIAGNOSIS — J101 Influenza due to other identified influenza virus with other respiratory manifestations: Secondary | ICD-10-CM

## 2018-01-29 DIAGNOSIS — R058 Other specified cough: Secondary | ICD-10-CM

## 2018-01-29 LAB — POCT FASTING CBG KUC MANUAL ENTRY: POCT Glucose (KUC): 152 mg/dL — AB (ref 70–99)

## 2018-01-29 LAB — POCT INFLUENZA A/B
Influenza A, POC: POSITIVE — AB
Influenza B, POC: NEGATIVE

## 2018-01-29 MED ORDER — BENZONATATE 100 MG PO CAPS: 100.0000 mg | ORAL_CAPSULE | Freq: Three times a day (TID) | ORAL | 0 refills | Status: DC

## 2018-01-29 MED ORDER — IBUPROFEN 400 MG PO TABS
400.0000 mg | ORAL_TABLET | Freq: Once | ORAL | Status: AC
  Administered 2018-01-29: 400 mg via ORAL

## 2018-01-29 MED ORDER — SODIUM CHLORIDE 0.9 % IV SOLN
Freq: Once | INTRAVENOUS | Status: AC
  Administered 2018-01-29: 12:00:00 via INTRAVENOUS

## 2018-01-29 MED ORDER — BALOXAVIR MARBOXIL(80 MG DOSE) 2 X 40 MG PO TBPK: 80.0000 mg | ORAL_TABLET | Freq: Once | ORAL | 0 refills | Status: AC

## 2018-01-29 MED ORDER — CLARITHROMYCIN 500 MG PO TABS: ORAL_TABLET | ORAL | 0 refills | Status: DC

## 2018-01-29 MED ORDER — ONDANSETRON 4 MG PO TBDP: 4.0000 mg | ORAL_TABLET | Freq: Three times a day (TID) | ORAL | 0 refills | Status: DC | PRN

## 2018-01-29 NOTE — ED Triage Notes (Signed)
Patient c/o productive cough with colored sputum and colored nasal discharge x yesterday. T-max last night of 101.6. Taken Advil last night.

## 2018-01-29 NOTE — Discharge Instructions (Signed)
Your flu test was positive for influenza A. Chest x-ray, no pneumonia.  But you also have bronchitis . For the bronchitis, we're prescribing Biaxin twice a day for 10 days. For influenza A, prescription for Xofluza, which is 1 dose treatment for the flu. (Please use card given which can lower the cost, otherwise it is expensive, even for 1 dose !) Zofran if needed for nausea. Tessalon Perles prescribed for cough. Here in urgent care, we were also dehydrated, and we gave you 1 L of IV fluids and you improved.  Be sure to drink plenty of fluids at home. Please see attached instruction sheets. If any severely worsening symptoms, call 911 and go to emergency room

## 2018-01-29 NOTE — ED Provider Notes (Signed)
[name]    CSN: [id number] Arrival date & time: 01/29/18  1031     History   Chief Complaint Chief Complaint  Patient presents with  . Cough  . Nasal Congestion   Here with husband HPI Molly Weeks is a 62 y.o. female.   HPI   HPI : Acute flu symptoms for 1 day. Fever to 101.1 with chills, sweats,  fatigue, headache.  Denies myalgias.   Symptoms are progressively worsening, despite trying OTC fever reducing medicine and rest and fluids. Has decreased appetite, but tolerating some liquids by mouth. No history of recent tick bite. Then, last night, started with worsening congested cough, productive of purulent yellow-green sputum, with chest congestion.  No definite shortness of breath.  Review of Systems: Positive for fatigue, mild nasal congestion, mild sore throat, mild swollen anterior neck glands, Negative for acute vision changes, stiff neck, focal weakness, syncope, seizures, respiratory distress, vomiting, diarrhea, GU symptoms, new rash.  Past Medical History:  Diagnosis Date  . Allergic rhinitis   . Diabetes mellitus without complication ([place]) 0/25/4270   Referred to RD/CDE  . Vitamin D deficiency 04/08/2017    Patient Active Problem List   Diagnosis Date Noted  . Vitamin D deficiency 04/08/2017  . Diabetes mellitus without complication ([place]) 62/37/6283  . ALLERGIC RHINITIS 01/11/2010  . Cough 01/11/2010    Past Surgical History:  Procedure Laterality Date  . TUBAL LIGATION  1990    OB History    Gravida Para Term Preterm AB Living   1 1       1    SAB TAB Ectopic Multiple Live Births                   Home Medications    Prior to Admission medications   Medication Sig Start Date End Date Taking? Authorizing Provider  Nutritional Supplements (VITAMIN D MAINTENANCE PO) Take by mouth.   Yes [provider]  Baloxavir Marboxil 80 MG Dose (XOFLUZA) 40 (2) MG TBPK Take 80 mg by mouth once for 1 dose. 01/29/18 01/29/18   Jacqulyn Cane, MD  benzonatate (TESSALON) 100 MG capsule Take 1 capsule (100 mg total) by mouth every 8 (eight) hours. As needed for cough 01/29/18   Jacqulyn Cane, MD  clarithromycin (BIAXIN) 500 MG tablet Take 1 twice a day for 10 days. 01/29/18   Jacqulyn Cane, MD  ondansetron (ZOFRAN-ODT) 4 MG disintegrating tablet Take 1 tablet (4 mg total) by mouth every 8 (eight) hours as needed for nausea or vomiting. 01/29/18   Jacqulyn Cane, MD    Family History Family History  Problem Relation Age of Onset  . Asthma Unknown        multiple relatives  . Allergies Unknown        multiple relatives  . Diabetes Paternal Grandfather   . Hypertension Paternal Grandfather   . Heart disease Paternal Grandfather   . Diabetes Paternal Grandmother   . Hypertension Paternal Grandmother   . Heart disease Paternal Grandmother   . Diabetes Maternal Grandmother   . Hypertension Maternal Grandmother   . Heart disease Maternal Grandmother   . Diabetes Maternal Grandfather   . Hypertension Maternal Grandfather   . Heart disease Maternal Grandfather   . Diabetes Father   . Hypertension Father   . Heart disease Father   . Hypertension Mother   . Diabetes Mother   . Heart disease Mother   . Cancer Brother     Social History Social History  Tobacco Use  . Smoking status: Never Smoker  . Smokeless tobacco: Never Used  Substance Use Topics  . Alcohol use: No  . Drug use: No     Allergies   Cephalexin; Moxifloxacin; and Tamiflu [oseltamivir phosphate] I questioned patient further about Tamiflu, she states she had severe nausea right after taking Tamiflu in 2018  Review of Systems Review of Systems  All other systems reviewed and are negative.    Physical Exam Triage Vital Signs ED Triage Vitals [01/29/18 1052]  Enc Vitals Group     BP 132/80     Pulse Rate 99     Resp 14     Temp (!) 101.1 F (38.4 C)     Temp Source Oral     SpO2 98 %     Weight 194 lb (88 kg)     Height      Head  Circumference      Peak Flow      Pain Score 0     Pain Loc      Pain Edu?      Excl. in [place]?    No data found.  Updated Vital Signs BP 109/74   Pulse 88   Temp 99.4 F (37.4 C) (Oral)   Resp 14   Wt 194 lb (88 kg)   SpO2 98%   BMI 33.30 kg/m    Physical Exam  Constitutional: She appears well-developed and well-nourished.  Non-toxic appearance. She appears ill (very fatigued, but no cardiorespiratory distress). No distress.  Occasional hacking cough noted  HENT:  Head: Normocephalic and atraumatic.  Right Ear: Tympanic membrane and external ear normal.  Left Ear: Tympanic membrane and external ear normal.  Nose: Rhinorrhea present.  Mouth/Throat: Posterior oropharyngeal erythema (Minimal injection) present. No oropharyngeal exudate.  Oral mucous membranes slightly dry, but some moisture  Eyes: Conjunctivae are normal. Right eye exhibits no discharge. Left eye exhibits no discharge. No scleral icterus.  Neck: Neck supple.  Cardiovascular: Normal rate, regular rhythm and normal heart sounds.  Pulmonary/Chest: No stridor. No respiratory distress. She has wheezes. She has rales.  Diffuse rhonchi in all lung fields. Breath sounds equal bilaterally. Minimal scattered late expiratory wheezes.  Good air movement bilaterally. mild right basilar crackles.  Abdominal: Soft. She exhibits no distension. There is no tenderness.  Musculoskeletal: She exhibits no edema.  Legs: No cyanosis, clubbing, or edema.  No tenderness or cords  Lymphadenopathy:    She has cervical adenopathy (mild shoddy anterior cervical nodes).  Neurological: She is alert.  Skin: Skin is warm and intact. No rash noted. She is diaphoretic.  Psychiatric: She has a normal mood and affect.  Nursing note and vitals reviewed.    UC Treatments / Results  Labs (all labs ordered are listed, but only abnormal results are displayed) Labs Reviewed  POCT INFLUENZA A/B - Abnormal; Notable for the following components:       Result Value   Influenza A, POC Positive (*)    All other components within normal limits  POCT FASTING CBG [facility] MANUAL ENTRY - Abnormal; Notable for the following components:   POCT Glucose ([facility]) 152 (*)    All other components within normal limits    EKG  EKG Interpretation None       Radiology Dg Chest 2 View  Result Date: 01/29/2018 CLINICAL DATA:  Cough, congestion and low-grade fever since last night. EXAM: CHEST - 2 VIEW COMPARISON:  PA and lateral chest 01/01/2017 and 01/11/2010. FINDINGS: The lungs  are clear. No consolidative process, pneumothorax or effusion. Heart size is normal. No acute bony abnormality. IMPRESSION: No acute disease. Electronically Signed   By: Inge Rise M.D.   On: 01/29/2018 11:39    Procedures Procedures (including critical care time)  Medications Ordered in UC Medications  ibuprofen (ADVIL,MOTRIN) tablet 400 mg (400 mg Oral Given 01/29/18 1048)  0.9 %  sodium chloride infusion ( Intravenous Stopped 01/29/18 1315)     Initial Impression / Assessment and Plan / UC Course  I have reviewed the triage vital signs and the nursing notes.  Pertinent labs & imaging results that were available during my care of the patient were reviewed by me and considered in my medical decision making (see chart for details).  Clinical Course as of Jan 29 1405  Thu Jan 29, 2018  1120 Ibuprofen given stat for fever. Patient evaluated, history and physical performed. I strongly suspect influenza, but also suspect acute bronchitis or pneumonia. Nasal swab for acute influenza test. Chest x-ray ordered.  [DM]  1206 While discussing treatment options with patient and husband, patient appeared stressed with the discussion, she felt very warm and flushed, and then had a brief syncopal event, mildly diaphoretic.  She was put in Trendelenburg, cool compresses, and sips of ginger ale and within seconds, she became conscious, responsive to verbal commands.  Blood  pressure 118/75, pulse 86, regular oxygen saturation 97% on room air, fingerstick blood sugar 155.  Heart: Regular rate and rhythm. Likely vasovagal event associated with dehydration from influenza.  I ordered normal saline IV here in urgent care.  [DM]  5176 Nonfocal neurologic exam.  Cranial nerves intact.  Moves all 4 extremities  [DM]    Clinical Course User Index [DM] Jacqulyn Cane, MD   Influenza test positive for influenza A. Chest x-ray: No infiltrate, although clinically I am suspicious for an early pneumonic process based on auscultation of lungs     Patient was given IV fluids, 1 L of normal saline IV over 2 hours.  She tolerated this well. She felt much better afterward and energy level improved.  Rechecked BP 115/80.  Pulse 80, regular  At time of discharge from urgent care, she was feeling much better and was alert and was able to stand and walk to wheelchair, and we transported her in wheelchair to car, husband to drive her home. Prior to discharge, treatment options further discussed at length.  With patient and husband.  For influenza A, I initially recommended Tamiflu, but patient states she had severe nausea last year after taking Tamiflu and although I offered to prescribe Zofran with the Tamiflu, she declined that option.  -Therefore, prescribed Baloxavir Marboxil 40 mg.  Number 2.  1 p.o. Stat. (I researched that this is FDA approved for influenza)  -Biaxin 500 BID to cover bacterial bronchitis.  She clinically may have an early pneumonia based on auscultation, although chest x-ray within normal limits. -Zofran prescribed as needed nausea -Tessalon Perles at her request as needed cough    Final diagnoses:  Fever and chills  Cough productive of purulent sputum  Influenza A  Dehydration  Acute bronchitis, unspecified organism    ED Discharge Orders        Ordered    clarithromycin (BIAXIN) 500 MG tablet     01/29/18 1317    ondansetron (ZOFRAN-ODT) 4 MG  disintegrating tablet  Every 8 hours PRN     01/29/18 1317    Baloxavir Marboxil 80 MG Dose (XOFLUZA) 40 (2)  MG TBPK   Once    Comments:  Prescription card given, hopefully to lower cost   01/29/18 1317    benzonatate (TESSALON) 100 MG capsule  Every 8 hours     01/29/18 1317     An After Visit Summary was printed and given to the patient and husband:  Your flu test was positive for influenza A. Chest x-ray, no pneumonia.  But you also have bronchitis . For the bronchitis, we're prescribing Biaxin twice a day for 10 days. For influenza A, prescription for Xofluza, which is 1 dose treatment for the flu. (Please use card given which can lower the cost, otherwise it is expensive, even for 1 dose !) Zofran if needed for nausea. Tessalon Perles prescribed for cough. Here in urgent care, we were also dehydrated, and we gave you 1 L of IV fluids and you improved.  Be sure to drink plenty of fluids at home. Please see attached instruction sheets. If any severely worsening symptoms, call 911 and go to emergency room  Controlled Substance Prescriptions [place] Controlled Substance Registry consulted? Not Applicable   Jacqulyn Cane, MD 01/29/18 1414

## 2018-01-29 NOTE — ED Notes (Signed)
After reviewing x-ray and flu results and treatment plan with Dr. Burnett Harry, patient's husband came out and reporting she was losing consciousness. Patient was non-responsive with eyes open and pupils dilated bilaterally, pale and diaphoretic. For <3 seconds she was not breathing. After laying her flat and a sternal rub she snorted then started breathing. She shortly slowly regained consciousness with pupils returning to normal. Fingerstick glucose 152, BP, 118/75.

## 2018-01-30 ENCOUNTER — Telehealth: Payer: Self-pay | Admitting: Emergency Medicine

## 2018-02-06 ENCOUNTER — Other Ambulatory Visit: Payer: Self-pay | Admitting: Obstetrics and Gynecology

## 2018-02-06 DIAGNOSIS — Z1231 Encounter for screening mammogram for malignant neoplasm of breast: Secondary | ICD-10-CM

## 2018-03-23 ENCOUNTER — Encounter (HOSPITAL_COMMUNITY): Payer: Self-pay

## 2018-03-23 ENCOUNTER — Ambulatory Visit (HOSPITAL_COMMUNITY)
Admission: RE | Admit: 2018-03-23 | Discharge: 2018-03-23 | Disposition: A | Discharge status: Home / Self Care | Payer: 59 | Source: Ambulatory Visit | Attending: Obstetrics and Gynecology | Admitting: Obstetrics and Gynecology

## 2018-03-23 DIAGNOSIS — Z1231 Encounter for screening mammogram for malignant neoplasm of breast: Secondary | ICD-10-CM | POA: Diagnosis present

## 2018-03-30 ENCOUNTER — Telehealth: Payer: Self-pay | Admitting: *Deleted

## 2018-03-30 NOTE — Telephone Encounter (Signed)
Unable to leave message

## 2018-03-31 NOTE — Telephone Encounter (Signed)
LMOVM that mammogram was normal.  

## 2018-04-03 ENCOUNTER — Ambulatory Visit (INDEPENDENT_AMBULATORY_CARE_PROVIDER_SITE_OTHER): Payer: 59 | Admitting: Adult Health

## 2018-04-03 ENCOUNTER — Encounter: Payer: Self-pay | Admitting: Adult Health

## 2018-04-03 VITALS — BP 122/70 | HR 78 | Resp 14 | Ht 64.0 in | Wt 200.0 lb

## 2018-04-03 DIAGNOSIS — Z01419 Encounter for gynecological examination (general) (routine) without abnormal findings: Secondary | ICD-10-CM | POA: Insufficient documentation

## 2018-04-03 DIAGNOSIS — Z1329 Encounter for screening for other suspected endocrine disorder: Secondary | ICD-10-CM | POA: Diagnosis not present

## 2018-04-03 DIAGNOSIS — E119 Type 2 diabetes mellitus without complications: Secondary | ICD-10-CM | POA: Diagnosis not present

## 2018-04-03 DIAGNOSIS — E559 Vitamin D deficiency, unspecified: Secondary | ICD-10-CM | POA: Diagnosis not present

## 2018-04-03 DIAGNOSIS — Z1211 Encounter for screening for malignant neoplasm of colon: Secondary | ICD-10-CM | POA: Diagnosis not present

## 2018-04-03 DIAGNOSIS — Z13220 Encounter for screening for lipoid disorders: Secondary | ICD-10-CM | POA: Diagnosis not present

## 2018-04-03 DIAGNOSIS — Z1212 Encounter for screening for malignant neoplasm of rectum: Secondary | ICD-10-CM

## 2018-04-03 LAB — HEMOCCULT GUIAC POC 1CARD (OFFICE): FECAL OCCULT BLD: NEGATIVE

## 2018-04-03 NOTE — Progress Notes (Signed)
Patient ID: Molly Weeks, female   DOB: 1955-12-22, 62 y.o.   MRN: [id number] History of Present Illness:  Molly Weeks is a 62 year old white female, married, PM in for a well woman gyn exam,she had a normal pap with negative HPV 04/01/17. PCP is Dr Molly Weeks.  Current Medications, Allergies, Past Medical History, Past Surgical History, Family History and Social History were reviewed in [place] record.     Review of Systems:  Patient denies any headaches, hearing loss, fatigue, blurred vision, shortness of breath, chest pain, abdominal pain, problems with bowel movements, urination, or intercourse. No joint pain or mood swings.   Physical Exam:BP 122/70 (BP Location: Left Arm, Patient Position: Sitting, Cuff Size: Large)   Pulse 78   Resp 14   Ht 5\' 4"  (1.626 m)   Wt 200 lb (90.7 kg)   BMI 34.33 kg/m  General:  Well developed, well nourished, no acute distress Skin:  Warm and dry Neck:  Midline trachea, normal thyroid, good ROM, no lymphadenopathy Lungs; Clear to auscultation bilaterally Breast:  No dominant palpable mass, retraction, or nipple discharge Cardiovascular: Regular rate and rhythm Abdomen:  Soft, non tender, no hepatosplenomegaly Pelvic:  External genitalia is normal in appearance, no lesions.  The vagina is normal in appearance. Urethra has no lesions or masses. The cervix is smooth.  Uterus is felt to be normal size, shape, and contour.  No adnexal masses or tenderness noted.Bladder is non tender, no masses felt. Rectal: Good sphincter tone, no polyps, or hemorrhoids felt.  Hemoccult negative. Extremities/musculoskeletal:  No swelling or varicosities noted, no clubbing or cyanosis Psych:  No mood changes, alert and cooperative,seems happy PHQ 2 score 0.  Impression: 1. Encounter for well woman exam with routine gynecological exam   2. Screening for colorectal cancer   3. Screening cholesterol level   4. Vitamin D deficiency   5. Diabetes  mellitus without complication ([place])   6. Screening for thyroid disorder       Plan: Check CBC,CMP,TSH and lipids,A1c and vitamin D Mammogram yearly Physical in 1 year Pap in 2021 Do cologuard, has from Dr Molly Weeks Has F/U with dietician,Molly Weeks in near future and Dr Molly Weeks her PCP.

## 2018-04-05 LAB — COMPREHENSIVE METABOLIC PANEL
A/G RATIO: 1.9 (ref 1.2–2.2)
ALBUMIN: 4.3 g/dL (ref 3.5–5.5)
ALT: 18 IU/L (ref 0–32)
AST: 18 IU/L (ref 0–40)
Alkaline Phosphatase: 97 IU/L (ref 39–117)
BUN / CREAT RATIO: 22 (ref 9–23)
BUN: 15 mg/dL (ref 6–24)
Bilirubin Total: 0.3 mg/dL (ref 0.0–1.2)
CALCIUM: 9.2 mg/dL (ref 8.7–10.2)
CO2: 23 mmol/L (ref 20–29)
CREATININE: 0.69 mg/dL (ref 0.57–1.00)
Chloride: 104 mmol/L (ref 96–106)
GFR, EST AFRICAN AMERICAN: 110 mL/min/{1.73_m2} (ref >59)
GFR, EST NON AFRICAN AMERICAN: 96 mL/min/{1.73_m2} (ref >59)
GLOBULIN, TOTAL: 2.3 g/dL (ref 1.5–4.5)
Glucose: 93 mg/dL (ref 65–99)
POTASSIUM: 4.4 mmol/L (ref 3.5–5.2)
SODIUM: 143 mmol/L (ref 134–144)
Total Protein: 6.6 g/dL (ref 6.0–8.5)

## 2018-04-05 LAB — CBC
HEMATOCRIT: 40.7 % (ref 34.0–46.6)
HEMOGLOBIN: 14 g/dL (ref 11.1–15.9)
MCH: 31.5 pg (ref 26.6–33.0)
MCHC: 34.4 g/dL (ref 31.5–35.7)
MCV: 92 fL (ref 79–97)
Platelets: 265 10*3/uL (ref 150–379)
RBC: 4.45 x10E6/uL (ref 3.77–5.28)
RDW: 13.1 % (ref 12.3–15.4)
WBC: 6.9 10*3/uL (ref 3.4–10.8)

## 2018-04-05 LAB — LIPID PANEL
CHOL/HDL RATIO: 4 ratio (ref 0.0–4.4)
Cholesterol, Total: 203 mg/dL — ABNORMAL HIGH (ref 100–199)
HDL: 51 mg/dL (ref >39)
LDL CALC: 135 mg/dL — AB (ref 0–99)
Triglycerides: 86 mg/dL (ref 0–149)
VLDL CHOLESTEROL CAL: 17 mg/dL (ref 5–40)

## 2018-04-05 LAB — HEMOGLOBIN A1C
ESTIMATED AVERAGE GLUCOSE: 108 mg/dL
HEMOGLOBIN A1C: 5.4 % (ref 4.8–5.6)

## 2018-04-05 LAB — VITAMIN D 25 HYDROXY (VIT D DEFICIENCY, FRACTURES): VIT D 25 HYDROXY: 35.6 ng/mL (ref 30.0–100.0)

## 2018-04-05 LAB — TSH: TSH: 2.82 u[IU]/mL (ref 0.450–4.500)

## 2018-04-07 ENCOUNTER — Telehealth: Payer: Self-pay | Admitting: Adult Health

## 2018-04-07 NOTE — Telephone Encounter (Signed)
Left message that labs were good, keep up the good work

## 2018-10-06 ENCOUNTER — Ambulatory Visit: Payer: 59 | Admitting: Nutrition

## 2020-02-03 DIAGNOSIS — Z23 Encounter for immunization: Secondary | ICD-10-CM | POA: Diagnosis not present

## 2020-03-03 DIAGNOSIS — Z23 Encounter for immunization: Secondary | ICD-10-CM | POA: Diagnosis not present

## 2020-04-04 ENCOUNTER — Other Ambulatory Visit (HOSPITAL_COMMUNITY): Payer: Self-pay | Admitting: Obstetrics and Gynecology

## 2020-04-04 DIAGNOSIS — Z1231 Encounter for screening mammogram for malignant neoplasm of breast: Secondary | ICD-10-CM

## 2020-04-12 ENCOUNTER — Other Ambulatory Visit: Payer: Self-pay

## 2020-04-12 ENCOUNTER — Ambulatory Visit (HOSPITAL_COMMUNITY)
Admission: RE | Admit: 2020-04-12 | Discharge: 2020-04-12 | Disposition: A | Discharge status: Home / Self Care | Payer: BC Managed Care – PPO | Source: Ambulatory Visit | Attending: Obstetrics and Gynecology | Admitting: Obstetrics and Gynecology

## 2020-04-12 DIAGNOSIS — Z1231 Encounter for screening mammogram for malignant neoplasm of breast: Secondary | ICD-10-CM | POA: Diagnosis not present

## 2020-04-12 NOTE — Progress Notes (Signed)
Negative mammogram repeat 1 yr.

## 2020-05-10 ENCOUNTER — Telehealth: Payer: Self-pay | Admitting: Adult Health

## 2020-05-10 NOTE — Telephone Encounter (Signed)

## 2020-05-11 ENCOUNTER — Encounter: Payer: Self-pay | Admitting: Adult Health

## 2020-05-11 ENCOUNTER — Ambulatory Visit (INDEPENDENT_AMBULATORY_CARE_PROVIDER_SITE_OTHER): Payer: BC Managed Care – PPO | Admitting: Adult Health

## 2020-05-11 ENCOUNTER — Other Ambulatory Visit (HOSPITAL_COMMUNITY)
Admission: RE | Admit: 2020-05-11 | Discharge: 2020-05-11 | Disposition: A | Discharge status: Home / Self Care | Payer: BC Managed Care – PPO | Source: Ambulatory Visit | Attending: Adult Health | Admitting: Adult Health

## 2020-05-11 VITALS — BP 135/74 | HR 78 | Ht 64.0 in | Wt 213.5 lb

## 2020-05-11 DIAGNOSIS — R7309 Other abnormal glucose: Secondary | ICD-10-CM | POA: Diagnosis not present

## 2020-05-11 DIAGNOSIS — Z1211 Encounter for screening for malignant neoplasm of colon: Secondary | ICD-10-CM

## 2020-05-11 DIAGNOSIS — N950 Postmenopausal bleeding: Secondary | ICD-10-CM | POA: Diagnosis not present

## 2020-05-11 DIAGNOSIS — Z01419 Encounter for gynecological examination (general) (routine) without abnormal findings: Secondary | ICD-10-CM | POA: Insufficient documentation

## 2020-05-11 DIAGNOSIS — Z1212 Encounter for screening for malignant neoplasm of rectum: Secondary | ICD-10-CM | POA: Diagnosis not present

## 2020-05-11 LAB — HEMOCCULT GUIAC POC 1CARD (OFFICE): Fecal Occult Blood, POC: NEGATIVE

## 2020-05-11 NOTE — Progress Notes (Signed)
Patient ID: Molly Weeks, female   DOB: June 30, 1956, 64 y.o.   MRN: [id number] History of Present Illness: Molly Weeks is a 64 year old white female, married, PM in for a well woman gyn exam and pap. 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 PCP is Dr [name].   Current Medications, Allergies, Past Medical History, Past Surgical History, Family History and Social History were reviewed in Early record.     Review of Systems: Patient denies any headaches, hearing loss, fatigue, blurred vision, shortness of breath, chest pain, abdominal pain, problems with bowel movements, urination, or intercourse(not active). No joint pain or mood swings. Had vaginal bleeding after second COVID vaccine in April.   Physical Exam:BP 135/74 (BP Location: Left Arm, Patient Position: Sitting, Cuff Size: Normal)   Pulse 78   Ht 5\' 4"  (1.626 m)   Wt 213 lb 8 oz (96.8 kg)   BMI 36.65 kg/m  General:  Well developed, well nourished, no acute distress Skin:  Warm and dry Neck:  Midline trachea, normal thyroid, good ROM, no lymphadenopathy,no carotid bruits heard. Lungs; Clear to auscultation bilaterally Breast:  No dominant palpable mass, retraction, or nipple discharge Cardiovascular: Regular rate and rhythm Abdomen:  Soft, non tender, no hepatosplenomegaly Pelvic:  External genitalia is normal in appearance, no lesions.  The vagina is normal in appearance. Urethra has no lesions or masses. The cervix is smooth, +brown blood at os, pap with high risk HPV 16/18 genotyping per formed.  Uterus is felt to be normal size, shape, and contour.  No adnexal masses or tenderness noted.Bladder is non tender, no masses felt. Rectal: Good sphincter tone, no polyps, or hemorrhoids felt.  Hemoccult negative. Extremities/musculoskeletal:  No swelling or varicosities noted, no clubbing or cyanosis Psych:  No mood changes, alert and cooperative,seems happy AA is 0 Fall risk is low PHQ  9 score is 0 Examination chaperoned by Celene Squibb LPN She requests labs is fasting.  Impression and Plan:   1. Encounter for gynecological examination with Papanicolaou smear of cervix Pap sent Physical in 1 year Pap in 3 if normal Mammogram yearly Check CBC,CMP,TSH and lipids  2. Screening for colorectal cancer Colonoscopy advised  3. PMB (postmenopausal bleeding) Return for GYN [place] to assess uterus, pt aware if endometrial thickened may need biopsy to rule out endometrial cancer   4. Elevated hemoglobin A1c Check A1c

## 2020-05-11 NOTE — Addendum Note (Signed)
Addended by: Derrek Monaco A on: 05/11/2020 09:57 AM   Modules accepted: Orders

## 2020-05-12 LAB — COMPREHENSIVE METABOLIC PANEL
ALT: 46 IU/L — ABNORMAL HIGH (ref 0–32)
AST: 37 IU/L (ref 0–40)
Albumin/Globulin Ratio: 1.6 (ref 1.2–2.2)
Albumin: 4.5 g/dL (ref 3.8–4.8)
Alkaline Phosphatase: 98 IU/L (ref 48–121)
BUN/Creatinine Ratio: 22 (ref 12–28)
BUN: 16 mg/dL (ref 8–27)
Bilirubin Total: 0.4 mg/dL (ref 0.0–1.2)
CO2: 22 mmol/L (ref 20–29)
Calcium: 9.3 mg/dL (ref 8.7–10.3)
Chloride: 102 mmol/L (ref 96–106)
Creatinine, Ser: 0.74 mg/dL (ref 0.57–1.00)
GFR calc Af Amer: 101 mL/min/{1.73_m2} (ref >59)
GFR calc non Af Amer: 88 mL/min/{1.73_m2} (ref >59)
Globulin, Total: 2.8 g/dL (ref 1.5–4.5)
Glucose: 119 mg/dL — ABNORMAL HIGH (ref 65–99)
Potassium: 4.8 mmol/L (ref 3.5–5.2)
Sodium: 137 mmol/L (ref 134–144)
Total Protein: 7.3 g/dL (ref 6.0–8.5)

## 2020-05-12 LAB — CBC
Hematocrit: 42.8 % (ref 34.0–46.6)
Hemoglobin: 14.5 g/dL (ref 11.1–15.9)
MCH: 31.9 pg (ref 26.6–33.0)
MCHC: 33.9 g/dL (ref 31.5–35.7)
MCV: 94 fL (ref 79–97)
Platelets: 275 10*3/uL (ref 150–450)
RBC: 4.55 x10E6/uL (ref 3.77–5.28)
RDW: 12.2 % (ref 11.7–15.4)
WBC: 8.3 10*3/uL (ref 3.4–10.8)

## 2020-05-12 LAB — HEMOGLOBIN A1C
Est. average glucose Bld gHb Est-mCnc: 131 mg/dL
Hgb A1c MFr Bld: 6.2 % — ABNORMAL HIGH (ref 4.8–5.6)

## 2020-05-12 LAB — LIPID PANEL
Chol/HDL Ratio: 4.9 ratio — ABNORMAL HIGH (ref 0.0–4.4)
Cholesterol, Total: 241 mg/dL — ABNORMAL HIGH (ref 100–199)
HDL: 49 mg/dL (ref >39)
LDL Chol Calc (NIH): 165 mg/dL — ABNORMAL HIGH (ref 0–99)
Triglycerides: 146 mg/dL (ref 0–149)
VLDL Cholesterol Cal: 27 mg/dL (ref 5–40)

## 2020-05-12 LAB — TSH: TSH: 3.68 u[IU]/mL (ref 0.450–4.500)

## 2020-05-15 ENCOUNTER — Telehealth: Payer: Self-pay | Admitting: Adult Health

## 2020-05-15 LAB — CYTOLOGY - PAP
Comment: NEGATIVE
Diagnosis: NEGATIVE
High risk HPV: NEGATIVE

## 2020-05-15 NOTE — Telephone Encounter (Signed)
Pt aware of labs and pap. Pap in 3 years  Decrease carbs and fats and increase walking and recheck CMP,lipids and A1c before thanksgiving

## 2020-05-24 ENCOUNTER — Telehealth: Payer: Self-pay | Admitting: Obstetrics and Gynecology

## 2020-05-24 NOTE — Telephone Encounter (Signed)
Called patient regarding appointment and the following message was left:   Updated visitor policy: We are now allowing one support person with you during your upcoming visit.  However, we do ask that they wear a mask and will also be screened at check-in.   We ask if you are sick, have any symptoms of COVID, have had any exposure to anyone suspected or confirmed of having COVID-19, or are awaiting test results for COVID-19, to call our office as we may need to reschedule you for a virtual visit or schedule your appointment for a later date.    Please know we will ask you these questions or similar questions when you arrive for your appointment and understand this is how we are keeping everyone safe.    Also,to keep you safe, please use the provided hand sanitizer when you enter the office. We are asking everyone in the office to wear a mask to help prevent the spread of germs. If you have a mask of your own, please wear it to your appointment, if not, we are happy to provide one for you.  Thank you for understanding and your cooperation.    [facility] Staff

## 2020-05-25 ENCOUNTER — Ambulatory Visit (INDEPENDENT_AMBULATORY_CARE_PROVIDER_SITE_OTHER): Payer: BC Managed Care – PPO

## 2020-05-25 DIAGNOSIS — N950 Postmenopausal bleeding: Secondary | ICD-10-CM

## 2020-05-25 NOTE — Progress Notes (Signed)
PELVIC US TA/TV:homogeneous anteverted,wnl,homogeneous thickened endometrium with color flow,EEC 16.7 mm, left ovary appears to be WNL,limited view of left ovary,right ovary not visualized,no free fluid,no pain during ultrasound

## 2020-05-26 ENCOUNTER — Telehealth: Payer: Self-pay | Admitting: Adult Health

## 2020-05-26 DIAGNOSIS — N950 Postmenopausal bleeding: Secondary | ICD-10-CM

## 2020-05-26 NOTE — Telephone Encounter (Signed)
Pt aware that left ovary and uterus looked normal, could not see right ovary, endometrium is 16.7 mm, will need endometrial biopsy, to make appt with Dr Glo Herring

## 2020-06-03 IMAGING — MG DIGITAL SCREENING BILAT W/ TOMO W/ CAD
6 of 10 series · 6 of 30 positions shown · non-contrast
Comparison: Previous exam(s).

CLINICAL DATA: Screening.

EXAM:
DIGITAL SCREENING BILATERAL MAMMOGRAM WITH TOMO AND CAD

[R MLO synth-2D (1 of 2)]
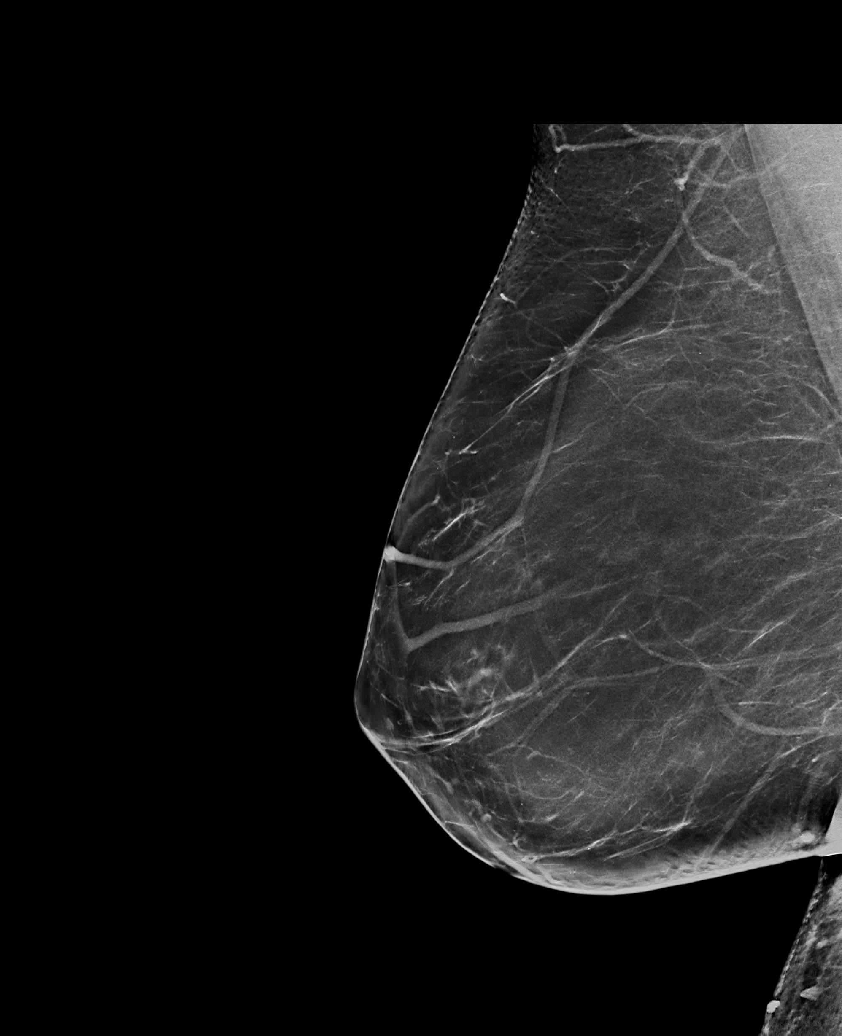

[R CC synth-2D]
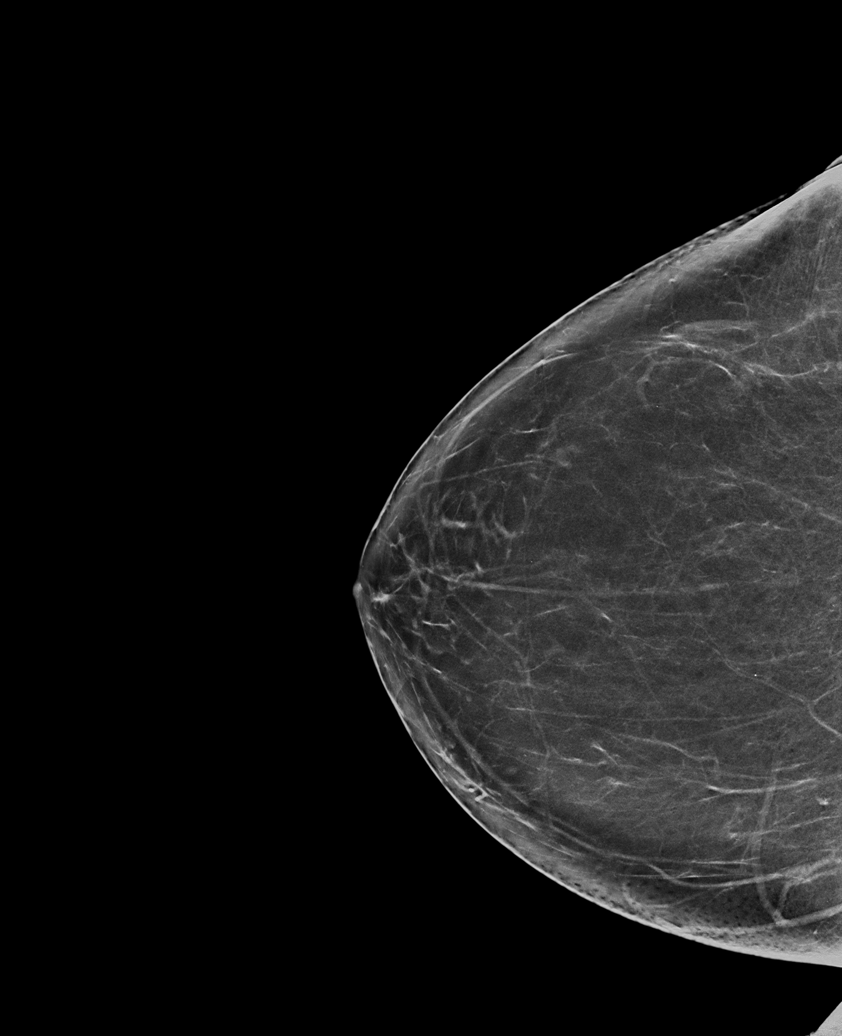

[R MLO synth-2D (2 of 2)]
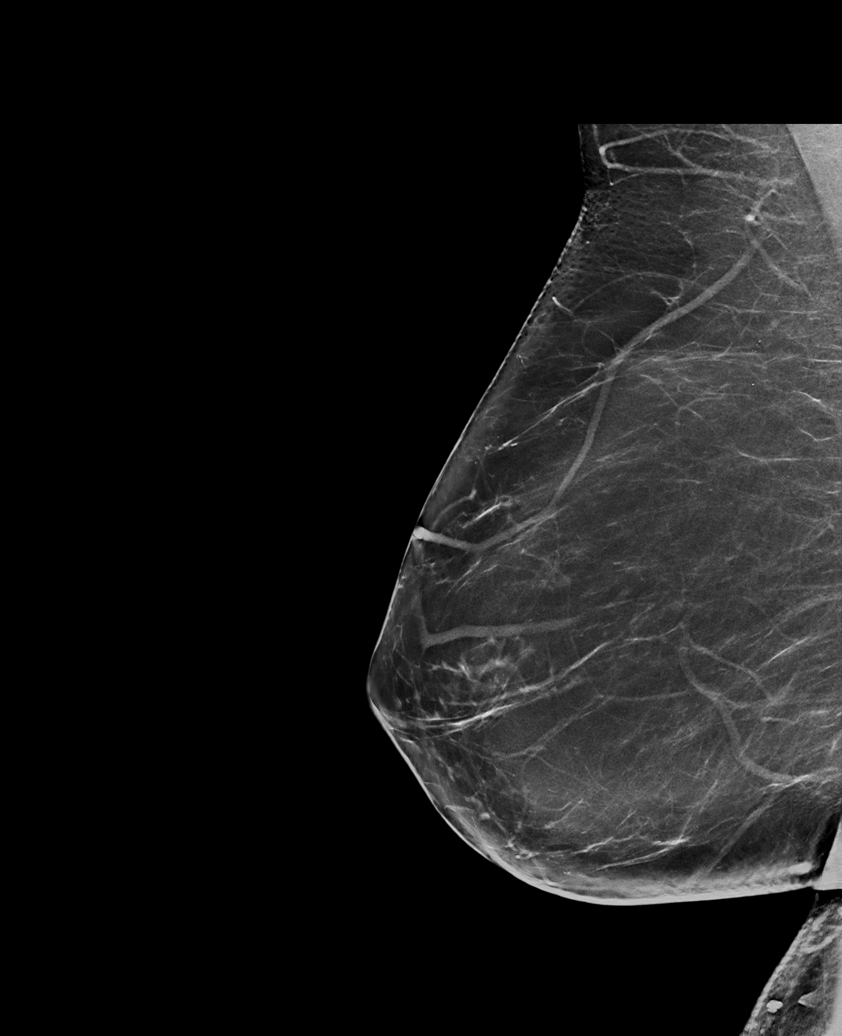

[L MLO synth-2D]
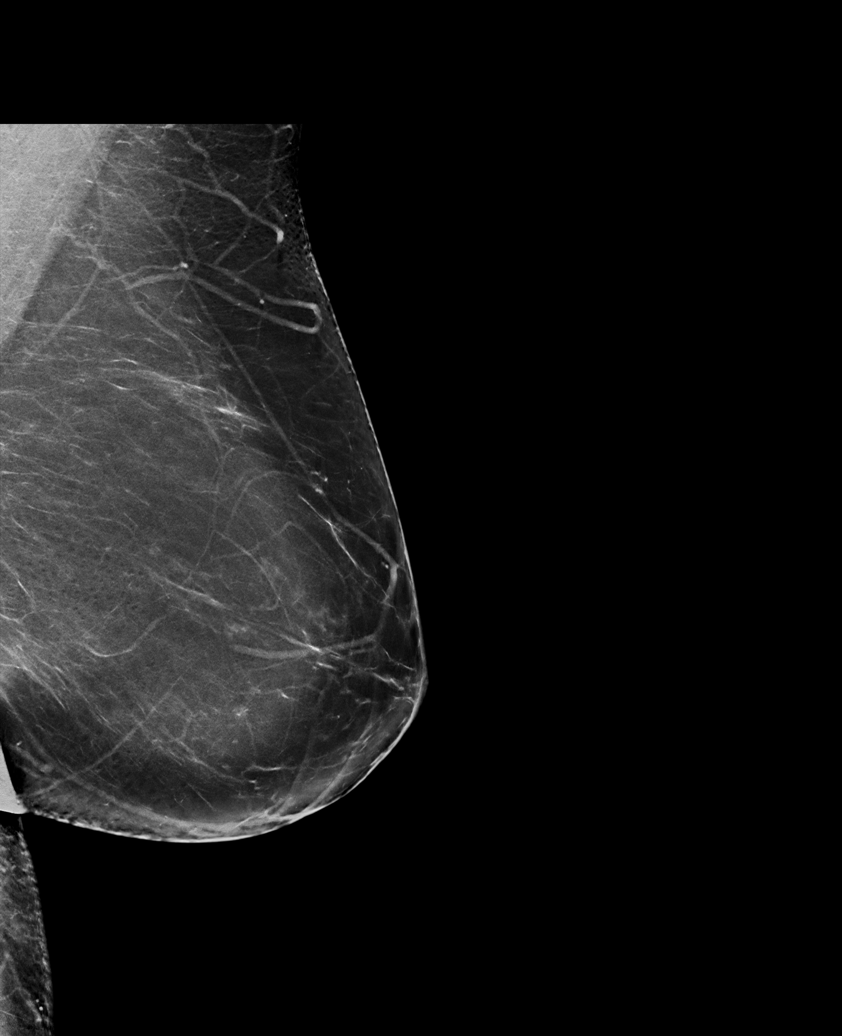

[L CC synth-2D]
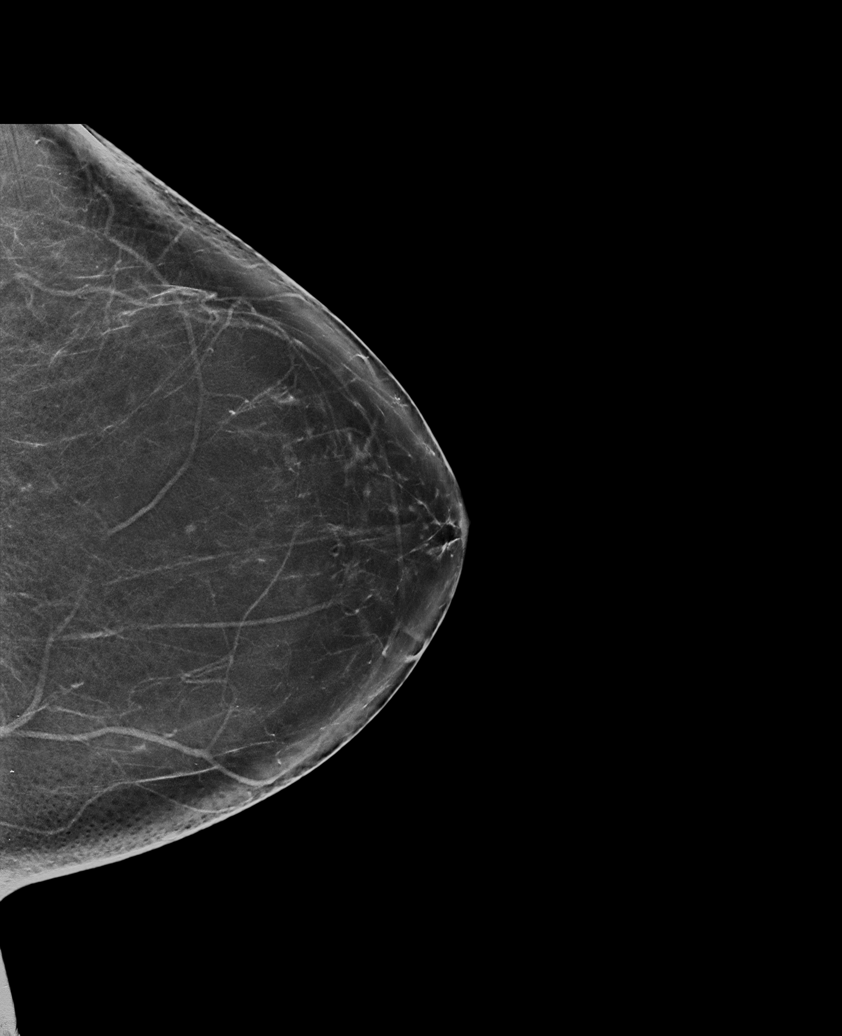

[R MLO tomo · tomo slice 38/75.0]
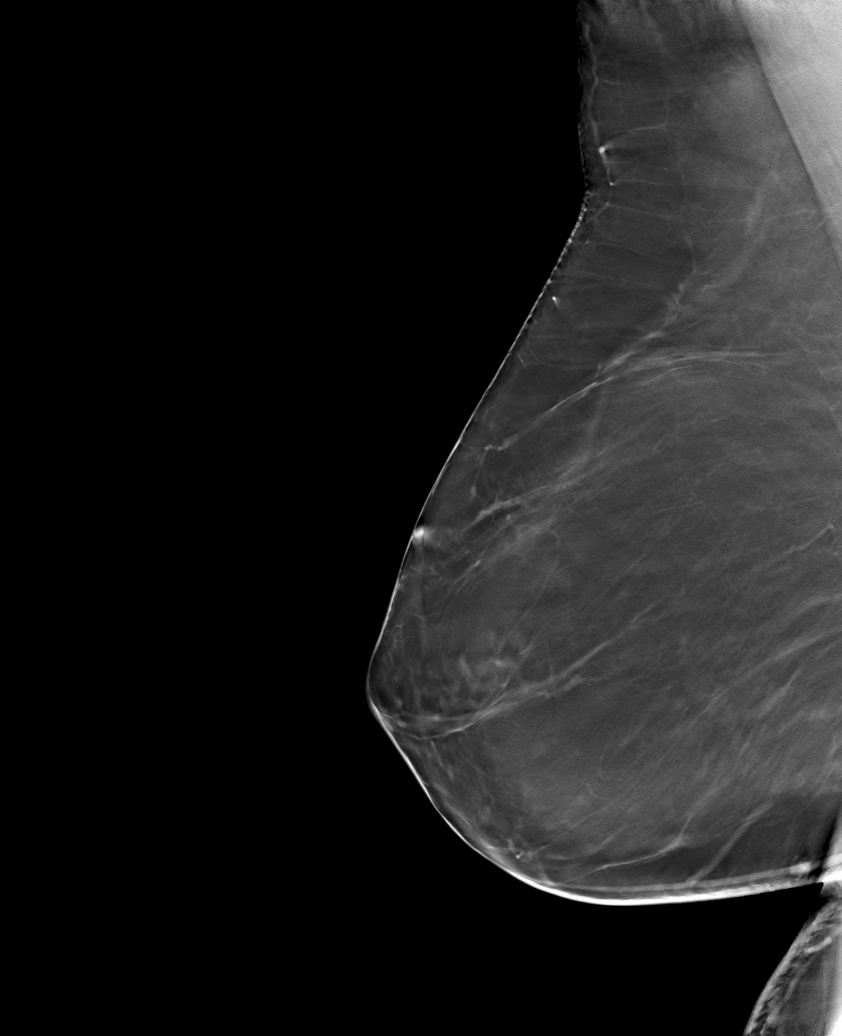

[6 of 30 positions shown; findings below may reference images not displayed]

ACR Breast Density Category b: There are scattered areas of
fibroglandular density.
FINDINGS: There are no findings suspicious for malignancy. Images were
processed with CAD.
IMPRESSION: No mammographic evidence of malignancy. A result letter of this
screening mammogram will be mailed directly to the patient.

RECOMMENDATION:
Screening mammogram in one year. (Code:[id number]-775)

BI-RADS CATEGORY  1: Negative.

## 2020-06-05 ENCOUNTER — Other Ambulatory Visit: Payer: Self-pay | Admitting: Obstetrics and Gynecology

## 2020-06-05 ENCOUNTER — Encounter: Payer: Self-pay | Admitting: Obstetrics and Gynecology

## 2020-06-05 ENCOUNTER — Ambulatory Visit (INDEPENDENT_AMBULATORY_CARE_PROVIDER_SITE_OTHER): Payer: BC Managed Care – PPO | Admitting: Obstetrics and Gynecology

## 2020-06-05 VITALS — BP 176/71 | HR 68 | Ht 64.0 in | Wt 213.8 lb

## 2020-06-05 DIAGNOSIS — Y838 Other surgical procedures as the cause of abnormal reaction of the patient, or of later complication, without mention of misadventure at the time of the procedure: Secondary | ICD-10-CM

## 2020-06-05 DIAGNOSIS — N950 Postmenopausal bleeding: Secondary | ICD-10-CM | POA: Diagnosis not present

## 2020-06-05 DIAGNOSIS — C541 Malignant neoplasm of endometrium: Secondary | ICD-10-CM | POA: Diagnosis not present

## 2020-06-05 DIAGNOSIS — Z9889 Other specified postprocedural states: Secondary | ICD-10-CM

## 2020-06-05 DIAGNOSIS — N841 Polyp of cervix uteri: Secondary | ICD-10-CM | POA: Diagnosis not present

## 2020-06-05 DIAGNOSIS — I97191 Other postprocedural cardiac functional disturbances following other surgery: Secondary | ICD-10-CM

## 2020-06-05 NOTE — Progress Notes (Signed)
Patient ID: Molly Weeks, female   DOB: [birth year], 65 y.o.   MRN: [id number]  Endometrial Biopsy: Patient given informed consent, signed copy in the chart, time out was performed. Time out taken. The patient was placed in the lithotomy position and the cervix brought into view with sterile speculum. 1.5 cm Cervical polyp was sharply  removed. Portion of cervix cleansed x 2 with betadine swabs.  A tenaculum was placed in the anterior lip of the cervix. The uterus was sounded for depth of 8 cm. Milex uterine Explora 3 mm was introduced to into the uterus, suction created,  and an endometrial sample was obtained. All equipment was removed and accounted for.  After the procedure, the patient became bradycardic due to the pain. Her heart rate was 44 and her BP was 128/70. She felt better after laying back and putting a cold towel on her neck. Repeat heart rate after 5 minutes was 56.  Patient given post procedure instructions.  Followup: with results by phone. Ultrasound in 2 weeks.  By signing my name below, I, De Burrs, attest that this documentation has been prepared under the direction and in the presence of Jonnie Kind, MD. Electronically Signed: De Burrs, Medical Scribe. 06/05/20. 2:24 PM.  I personally performed the services described in this documentation, which was SCRIBED in my presence. The recorded information has been reviewed and considered accurate. It has been edited as necessary during review. Jonnie Kind, MD

## 2020-06-05 NOTE — Addendum Note (Signed)
Addended by: Linton Rump on: 06/05/2020 04:43 PM   Modules accepted: Orders

## 2020-06-08 ENCOUNTER — Telehealth: Payer: Self-pay | Admitting: Obstetrics and Gynecology

## 2020-06-08 NOTE — Telephone Encounter (Signed)
[facility] is called and the results of the pathology reviewed, and recommendations for referral to Dr Denman George and associates is discussed.  Pt to expect a f/u call from their office soon.

## 2020-06-08 NOTE — Progress Notes (Signed)
Pathology, FIGO Endometrial Cancer Grade II, on endometrial biopsy. Pt aware, VM to [facility] placed.  Large endocervical polyp removed the same visit was benign.

## 2020-06-11 ENCOUNTER — Telehealth: Payer: Self-pay | Admitting: Obstetrics and Gynecology

## 2020-06-11 NOTE — Telephone Encounter (Signed)
Pt has appt on 7/26 with gyn onc.

## 2020-06-12 ENCOUNTER — Inpatient Hospital Stay: Payer: BC Managed Care – PPO | Attending: Gynecologic Oncology | Admitting: Gynecologic Oncology

## 2020-06-12 ENCOUNTER — Encounter: Payer: Self-pay | Admitting: Gynecologic Oncology

## 2020-06-12 ENCOUNTER — Other Ambulatory Visit: Payer: Self-pay | Admitting: Gynecologic Oncology

## 2020-06-12 ENCOUNTER — Other Ambulatory Visit: Payer: Self-pay

## 2020-06-12 VITALS — BP 147/65 | HR 85 | Temp 98.5°F | Resp 16 | Ht 64.0 in | Wt 213.2 lb

## 2020-06-12 DIAGNOSIS — C541 Malignant neoplasm of endometrium: Secondary | ICD-10-CM

## 2020-06-12 DIAGNOSIS — R9389 Abnormal findings on diagnostic imaging of other specified body structures: Secondary | ICD-10-CM | POA: Insufficient documentation

## 2020-06-12 DIAGNOSIS — Z6836 Body mass index (BMI) 36.0-36.9, adult: Secondary | ICD-10-CM | POA: Insufficient documentation

## 2020-06-12 DIAGNOSIS — E119 Type 2 diabetes mellitus without complications: Secondary | ICD-10-CM | POA: Insufficient documentation

## 2020-06-12 DIAGNOSIS — Z01818 Encounter for other preprocedural examination: Secondary | ICD-10-CM | POA: Insufficient documentation

## 2020-06-12 DIAGNOSIS — E669 Obesity, unspecified: Secondary | ICD-10-CM | POA: Insufficient documentation

## 2020-06-12 MED ORDER — TRAMADOL HCL 50 MG PO TABS: 50.0000 mg | ORAL_TABLET | Freq: Four times a day (QID) | ORAL | 0 refills | Status: DC | PRN

## 2020-06-12 MED ORDER — IBUPROFEN 600 MG PO TABS: 600.0000 mg | ORAL_TABLET | Freq: Four times a day (QID) | ORAL | 0 refills | Status: DC | PRN

## 2020-06-12 MED ORDER — SENNOSIDES-DOCUSATE SODIUM 8.6-50 MG PO TABS: 2.0000 | ORAL_TABLET | Freq: Every day | ORAL | 0 refills | Status: DC

## 2020-06-12 NOTE — Patient Instructions (Signed)
Preparing for your Surgery  Plan for surgery on June 22, 2020 with Dr. Everitt Amber at [place] will be scheduled for a robotic assisted total laparoscopic hysterectomy (removal of uterus and cervix), bilateral salpingo-oophorectomy (removal of both fallopian tubes and ovaries), sentinel lymph node biopsy, possible lymph node dissection.   Pre-operative Testing -You will receive a phone call from presurgical testing at [facility] to arrange for a pre-operative appointment over the phone, lab appointment, and COVID test. The COVID test normally happens 3 days prior to the surgery and they ask that you self quarantine after the test up until surgery to decrease chance of exposure.  -Bring your insurance card, copy of an advanced directive if applicable, medication list  -At that visit, you will be asked to sign a consent for a possible blood transfusion in case a transfusion becomes necessary during surgery.  The need for a blood transfusion is rare but having consent is a necessary part of your care.     -You should not be taking blood thinners or aspirin at least ten days prior to surgery unless instructed by your surgeon.  -Do not take supplements such as fish oil (omega 3), red yeast rice, turmeric before your surgery.   Day Before Surgery at [place] will be asked to take in a light diet the day before surgery. You will be advised you can have clear liquids after midnight and up until 3 hours before your surgery.    Eat a light diet the day before surgery.  Examples including soups, broths, toast, yogurt, mashed potatoes.  AVOID GAS PRODUCING FOODS. Things to avoid include carbonated beverages (fizzy beverages), raw fruits and raw vegetables, or beans.   If your bowels are filled with gas, your surgeon will have difficulty visualizing your pelvic organs which increases your surgical risks.  Your role in recovery Your role is to become active as soon as directed by  your doctor, while still giving yourself time to heal.  Rest when you feel tired. You will be asked to do the following in order to speed your recovery:  - Cough and breathe deeply. This helps to clear and expand your lungs and can prevent pneumonia after surgery.  - [place]. Do mild physical activity. Walking or moving your legs help your circulation and body functions return to normal. Do not try to get up or walk alone the first time after surgery.   -If you develop swelling on one leg or the other, pain in the back of your leg, redness/warmth in one of your legs, please call the office or go to the Emergency Room to have a doppler to rule out a blood clot. For shortness of breath, chest pain-seek care in the Emergency Room as soon as possible. - Actively manage your pain. Managing your pain lets you move in comfort. We will ask you to rate your pain on a scale of zero to 10. It is your responsibility to tell your doctor or nurse where and how much you hurt so your pain can be treated.  Special Considerations -If you are diabetic, you may be placed on insulin after surgery to have closer control over your blood sugars to promote healing and recovery.  This does not mean that you will be discharged on insulin.  If applicable, your oral antidiabetics will be resumed when you are tolerating a solid diet.  -Your final pathology results from surgery should be available around  one week after surgery and the results will be relayed to you when available.  -Dr. Lahoma Crocker is the surgeon that assists your GYN Oncologist with surgery.  If you end up staying the night, the next day after your surgery you will either see Dr. Denman George, Dr. Berline Lopes, or Dr. Lahoma Crocker.  -FMLA forms can be faxed to [phone] and please allow 5-7 business days for completion.  Pain Management After Surgery -You have been prescribed your pain medication and bowel regimen medications before  surgery so that you can have these available when you are discharged from the hospital. The pain medication is for use ONLY AFTER surgery and a new prescription will not be given.   -Make sure that you have Tylenol and Ibuprofen at home to use on a regular basis after surgery for pain control. We recommend alternating the medications every hour to six hours since they work differently and are processed in the body differently for pain relief.  -Review the attached handout on narcotic use and their risks and side effects.   Bowel Regimen -You have been prescribed Sennakot-S to take nightly to prevent constipation especially if you are taking the narcotic pain medication intermittently.  It is important to prevent constipation and drink adequate amounts of liquids. You can stop taking this medication when you are not taking pain medication and you are back on your normal bowel routine.  Risks of Surgery Risks of surgery are low but include bleeding, infection, damage to surrounding structures, re-operation, blood clots, and very rarely death.   Blood Transfusion Information (For the consent to be signed before surgery)  We will be checking your blood type before surgery so in case of emergencies, we will know what type of blood you would need.                                            WHAT IS A BLOOD TRANSFUSION?  A transfusion is the replacement of blood or some of its parts. Blood is made up of multiple cells which provide different functions.  Red blood cells carry oxygen and are used for blood loss replacement.  White blood cells fight against infection.  Platelets control bleeding.  Plasma helps clot blood.  Other blood products are available for specialized needs, such as hemophilia or other clotting disorders. BEFORE THE TRANSFUSION  Who gives blood for transfusions?   You may be able to donate blood to be used at a later date on yourself (autologous donation).  Relatives can  be asked to donate blood. This is generally not any safer than if you have received blood from a stranger. The same precautions are taken to ensure safety when a relative's blood is donated.  Healthy volunteers who are fully evaluated to make sure their blood is safe. This is blood bank blood. Transfusion therapy is the safest it has ever been in the practice of medicine. Before blood is taken from a donor, a complete history is taken to make sure that person has no history of diseases nor engages in risky social behavior (examples are intravenous drug use or sexual activity with multiple partners). The donor's travel history is screened to minimize risk of transmitting infections, such as malaria. The donated blood is tested for signs of infectious diseases, such as HIV and hepatitis. The blood is then tested to be sure it is compatible  with you in order to minimize the chance of a transfusion reaction. If you or a relative donates blood, this is often done in anticipation of surgery and is not appropriate for emergency situations. It takes many days to process the donated blood. RISKS AND COMPLICATIONS Although transfusion therapy is very safe and saves many lives, the main dangers of transfusion include:   Getting an infectious disease.  Developing a transfusion reaction. This is an allergic reaction to something in the blood you were given. Every precaution is taken to prevent this. The decision to have a blood transfusion has been considered carefully by your caregiver before blood is given. Blood is not given unless the benefits outweigh the risks.  AFTER SURGERY INSTRUCTIONS  Return to work: 4-6 weeks if applicable  Activity: 1. Be up and out of the bed during the day.  Take a nap if needed.  You may walk up steps but be careful and use the hand rail.  Stair climbing will tire you more than you think, you may need to stop part way and rest.   2. No lifting or straining for 6 weeks over 10  pounds. No pushing, pulling, straining for 6 weeks.  3. No driving for 1 week(s).  Do not drive if you are taking narcotic pain medicine and make sure that your reaction time has returned.   4. You can shower as soon as the next day after surgery. Shower daily.  Use soap and water on your incision and pat dry; don't rub.  No tub baths or submerging your body in water until cleared by your surgeon. If you have the soap that was given to you by pre-surgical testing that was used before surgery, you do not need to use it afterwards because this can irritate your incisions.   5. No sexual activity and nothing in the vagina for 8 weeks.  6. You may experience a small amount of clear drainage from your incisions, which is normal.  If the drainage persists, increases, or changes color please call the office.  7. Do not use creams, lotions, or ointments such as neosporin on your incisions after surgery until advised by your surgeon because they can cause removal of the dermabond glue on your incisions.    8. You may experience vaginal spotting after surgery or around the 6-8 week mark from surgery when the stitches at the top of the vagina begin to dissolve.  The spotting is normal but if you experience heavy bleeding, call our office.  9. Take Tylenol or ibuprofen first for pain and only use narcotic pain medication for severe pain not relieved by the Tylenol or Ibuprofen.  Monitor your Tylenol intake to a max of 4,000 mg in a 24 hour period. You can alternate these medications after surgery.  Diet: 1. Low sodium Heart Healthy Diet is recommended.  2. It is safe to use a laxative, such as Miralax or Colace, if you have difficulty moving your bowels. You have been prescribed Sennakot at bedtime every evening to keep bowel movements regular and to prevent constipation.    Wound Care: 1. Keep clean and dry.  Shower daily.  Reasons to call the Doctor:  Fever - Oral temperature greater than 100.4  degrees Fahrenheit  Foul-smelling vaginal discharge  Difficulty urinating  Nausea and vomiting  Increased pain at the site of the incision that is unrelieved with pain medicine.  Difficulty breathing with or without chest pain  New calf pain especially if only  on one side  Sudden, continuing increased vaginal bleeding with or without clots.   Contacts: For questions or concerns you should contact:  Dr. Everitt Amber at [phone]  Joylene John, NP at [phone]  After Hours: call [phone] and have the GYN Oncologist paged/contacted

## 2020-06-12 NOTE — Progress Notes (Signed)
Consult Note: Gyn-Onc  Consult was requested by Dr. Glo Herring for the evaluation of Molly Weeks 64 y.o. female  CC:  Chief Complaint  Patient presents with  . Endometrial cancer [facility])    New Patient    Assessment/Plan:  Molly. DELANIA Weeks  is a 64 y.o.  year old with FIGO grade 2 endometrioid endometrial adenocarcinoma, clinical stage I.  A detailed discussion was held with the patient and her family with regard to to her endometrial cancer diagnosis. We discussed the standard management options for uterine cancer which includes surgery followed possibly by adjuvant therapy depending on the results of surgery. The options for surgical management include a hysterectomy and removal of the tubes and ovaries possibly with removal of pelvic and para-aortic lymph nodes.If feasible, a minimally invasive approach including a robotic hysterectomy or laparoscopic hysterectomy have benefits including shorter hospital stay, recovery time and better wound healing than with open surgery. The patient has been counseled about these surgical options and the risks of surgery in general including infection, bleeding, damage to surrounding structures (including bowel, bladder, ureters, nerves or vessels), and the postoperative risks of PE/ DVT, and lymphedema. I extensively reviewed the additional risks of robotic hysterectomy including possible need for conversion to open laparotomy.  I discussed positioning during surgery of trendelenberg and risks of minor facial swelling and care we take in preoperative positioning.  After counseling and consideration of her options, she desires to proceed with robotic assisted total hysterectomy with bilateral sapingo-oophorectomy and SLN biopsy.   She will be seen by anesthesia for preoperative clearance and discussion of postoperative pain management.  She was given the opportunity to ask questions, which were answered to her satisfaction, and she is agreement with the above  mentioned plan of care.  HPI: Molly Molly Weeks is a 64 year old P1 who was seen in consultation at the request of Dr Glo Herring for evaluation of grade 2 endometrioid endometrial cancer.  The patient reported a history of postmenopausal bleeding that occurred in July, 3 days before her scheduled annual gynecologic visit with Derrek Monaco, the nurse practitioner who works with Dr. Glo Herring.  She reported her symptoms to [facility] at that time who performed a Pap smear and recommended an ultrasound.  An endocervical polyp was seen.  The ultrasound was performed on 06/04/2020 and revealed a uterus measuring 6.8 x 3.9 x 5.4 cm with a 16.7 mm thickened endometrium.  The right ovary was not visualized.  The left ovary measured 2 x 2.2 x 1.6 cm.  The following day a biopsy of the cervix (removal of the cervical polyp) and endometrial biopsy were performed on 06/05/2020.  Final pathology from this revealed a benign endocervical type polyp with no malignancy.  Additionally the endometrial biopsy confirmed FIGO grade 2 endometrioid adenocarcinoma.  The patient's medical history is remarkable for diabetes mellitus for which she is controlled with diet.  Her last hemoglobin A1c in June 2021 was 6.2.  She has obesity with a BMI of 36.6 kg meters squared.  Her only prior surgical history was a tubal ligation.  She has had 1 prior vaginal delivery.  Her family history is remarkable for a brother with leukemia and multiple myeloma.  The patient social history is remarkable for being a former Optometrist.  She is in between jobs at present.  She lives with her husband who is independent in activities of daily living.  The patient herself is of good functional status and can ambulate and climb a  flight of stairs without shortness of breath or chest pain.   Current Meds:  Outpatient Encounter Medications as of 06/12/2020  Medication Sig  . Cholecalciferol 25 MCG (1000 UT) tablet Take by mouth.   No  facility-administered encounter medications on file as of 06/12/2020.    Allergy:  Allergies  Allergen Reactions  . Cephalexin Diarrhea and Nausea And Vomiting  . Moxifloxacin Rash    REACTION: rash, fever  . Tamiflu [Oseltamivir Phosphate]     Severe nausea right after taking in 2018    Social Hx:   Social History   Socioeconomic History  . Marital status: Married    Spouse name: Not on file  . Number of children: Not on file  . Years of education: Not on file  . Highest education level: Not on file  Occupational History  . Occupation: Optometrist  Tobacco Use  . Smoking status: Never Smoker  . Smokeless tobacco: Never Used  Vaping Use  . Vaping Use: Never used  Substance and Sexual Activity  . Alcohol use: No  . Drug use: No  . Sexual activity: Not Currently    Birth control/protection: Surgical, Post-menopausal    Comment: tubal  Other Topics Concern  . Not on file  Social History Narrative  . Not on file   Social Determinants of Health   Financial Resource Strain: Low Risk   . Difficulty of Paying Living Expenses: Not hard at all  Food Insecurity: No Food Insecurity  . Worried About Charity fundraiser in the Last Year: Never true  . Ran Out of Food in the Last Year: Never true  Transportation Needs: No Transportation Needs  . Lack of Transportation (Medical): No  . Lack of Transportation (Non-Medical): No  Physical Activity: Insufficiently Active  . Days of Exercise per Week: 3 days  . Minutes of Exercise per Session: 30 min  Stress: No Stress Concern Present  . Feeling of Stress : Not at all  Social Connections: Socially Integrated  . Frequency of Communication with Friends and Family: Twice a week  . Frequency of Social Gatherings with Friends and Family: Once a week  . Attends Religious Services: More than 4 times per year  . Active Member of Clubs or Organizations: Yes  . Attends Archivist Meetings: More than 4 times per year  . Marital  Status: Married  Human resources officer Violence: Not At Risk  . Fear of Current or Ex-Partner: No  . Emotionally Abused: No  . Physically Abused: No  . Sexually Abused: No    Past Surgical Hx:  Past Surgical History:  Procedure Laterality Date  . TUBAL LIGATION  1990    Past Medical Hx:  Past Medical History:  Diagnosis Date  . Allergic rhinitis   . Diabetes mellitus without complication ([place]) 1/75/1025   Referred to RD/CDE  . Vitamin D deficiency 04/08/2017    Past Gynecological History:  See HPI, P1 (SVD). No LMP recorded. Patient is postmenopausal.  Family Hx:  Family History  Problem Relation Age of Onset  . Asthma Other        multiple relatives  . Allergies Other        multiple relatives  . Diabetes Paternal Grandfather   . Hypertension Paternal Grandfather   . Heart disease Paternal Grandfather   . Diabetes Paternal Grandmother   . Hypertension Paternal Grandmother   . Heart disease Paternal Grandmother   . Diabetes Maternal Grandmother   . Hypertension Maternal Grandmother   .  Heart disease Maternal Grandmother   . Diabetes Maternal Grandfather   . Hypertension Maternal Grandfather   . Heart disease Maternal Grandfather   . Diabetes Father   . Hypertension Father   . Heart disease Father   . Hypertension Mother   . Diabetes Mother   . Heart disease Mother   . Cancer Brother        leukemia    Review of Systems:  Constitutional  Feels well,    ENT Normal appearing ears and nares bilaterally Skin/Breast  No rash, sores, jaundice, itching, dryness Cardiovascular  No chest pain, shortness of breath, or edema  Pulmonary  No cough or wheeze.  Gastro Intestinal  No nausea, vomitting, or diarrhoea. No bright red blood per rectum, no abdominal pain, change in bowel movement, or constipation.  Genito Urinary  No frequency, urgency, dysuria, + postmenopausal bleeding Musculo Skeletal  No myalgia, arthralgia, joint swelling or pain  Neurologic  No  weakness, numbness, change in gait,  Psychology  No depression, anxiety, insomnia.   Vitals:  Blood pressure (!) 147/65, pulse 85, temperature 98.5 F (36.9 C), temperature source Oral, resp. rate 16, height 5' 4" (1.626 m), weight (!) 213 lb 4 oz (96.7 kg), SpO2 98 %.  Physical Exam: WD in NAD Neck  Supple NROM, without any enlargements.  Lymph Node Survey No cervical supraclavicular or inguinal adenopathy Cardiovascular  Pulse normal rate, regularity and rhythm. S1 and S2 normal.  Lungs  Clear to auscultation bilateraly, without wheezes/crackles/rhonchi. Good air movement.  Skin  No rash/lesions/breakdown  Psychiatry  Alert and oriented to person, place, and time  Abdomen  Normoactive bowel sounds, abdomen soft, non-tender and obese without evidence of hernia.  Back No CVA tenderness Genito Urinary  Vulva/vagina: Normal external female genitalia.  No lesions. No discharge or bleeding.  Bladder/urethra:  No lesions or masses, well supported bladder  Vagina: normal  Cervix: Normal appearing, no lesions.  Uterus:  Small, mobile, no parametrial involvement or nodularity.  Adnexa: no palpable masses. Rectal  deferred Extremities  No bilateral cyanosis, clubbing or edema.   [name], MD  06/12/2020, 10:25 AM

## 2020-06-12 NOTE — H&P (View-Only) (Signed)
Consult Note: Gyn-Onc  Consult was requested by Dr. Ferguson for the evaluation of Molly Weeks 64 y.o. female  CC:  Chief Complaint  Patient presents with  . Endometrial cancer ([facility])    New Patient    Assessment/Plan:  Molly Weeks  is a 64 y.o.  year old with FIGO grade 2 endometrioid endometrial adenocarcinoma, clinical stage I.  A detailed discussion was held with the patient and her family with regard to to her endometrial cancer diagnosis. We discussed the standard management options for uterine cancer which includes surgery followed possibly by adjuvant therapy depending on the results of surgery. The options for surgical management include a hysterectomy and removal of the tubes and ovaries possibly with removal of pelvic and para-aortic lymph nodes.If feasible, a minimally invasive approach including a robotic hysterectomy or laparoscopic hysterectomy have benefits including shorter hospital stay, recovery time and better wound healing than with open surgery. The patient has been counseled about these surgical options and the risks of surgery in general including infection, bleeding, damage to surrounding structures (including bowel, bladder, ureters, nerves or vessels), and the postoperative risks of PE/ DVT, and lymphedema. I extensively reviewed the additional risks of robotic hysterectomy including possible need for conversion to open laparotomy.  I discussed positioning during surgery of trendelenberg and risks of minor facial swelling and care we take in preoperative positioning.  After counseling and consideration of her options, she desires to proceed with robotic assisted total hysterectomy with bilateral sapingo-oophorectomy and SLN biopsy.   She will be seen by anesthesia for preoperative clearance and discussion of postoperative pain management.  She was given the opportunity to ask questions, which were answered to her satisfaction, and she is agreement with the above  mentioned plan of care.  HPI: [name] is a 64 year old P1 who was seen in consultation at the request of Dr Ferguson for evaluation of grade 2 endometrioid endometrial cancer.  The patient reported a history of postmenopausal bleeding that occurred in July, 3 days before her scheduled annual gynecologic visit with Jennifer Griffin, the nurse practitioner who works with Dr. Ferguson.  She reported her symptoms to Jennifer at that time who performed a Pap smear and recommended an ultrasound.  An endocervical polyp was seen.  The ultrasound was performed on 06/04/2020 and revealed a uterus measuring 6.8 x 3.9 x 5.4 cm with a 16.7 mm thickened endometrium.  The right ovary was not visualized.  The left ovary measured 2 x 2.2 x 1.6 cm.  The following day a biopsy of the cervix (removal of the cervical polyp) and endometrial biopsy were performed on 06/05/2020.  Final pathology from this revealed a benign endocervical type polyp with no malignancy.  Additionally the endometrial biopsy confirmed FIGO grade 2 endometrioid adenocarcinoma.  The patient's medical history is remarkable for diabetes mellitus for which she is controlled with diet.  Her last hemoglobin A1c in June 2021 was 6.2.  She has obesity with a BMI of 36.6 kg meters squared.  Her only prior surgical history was a tubal ligation.  She has had 1 prior vaginal delivery.  Her family history is remarkable for a brother with leukemia and multiple myeloma.  The patient social history is remarkable for being a former accountant.  She is in between jobs at present.  She lives with her husband who is independent in activities of daily living.  The patient herself is of good functional status and can ambulate and climb a   flight of stairs without shortness of breath or chest pain.   Current Meds:  Outpatient Encounter Medications as of 06/12/2020  Medication Sig  . Cholecalciferol 25 MCG (1000 UT) tablet Take by mouth.   No  facility-administered encounter medications on file as of 06/12/2020.    Allergy:  Allergies  Allergen Reactions  . Cephalexin Diarrhea and Nausea And Vomiting  . Moxifloxacin Rash    REACTION: rash, fever  . Tamiflu [Oseltamivir Phosphate]     Severe nausea right after taking in 2018    Social Hx:   Social History   Socioeconomic History  . Marital status: Married    Spouse name: Not on file  . Number of children: Not on file  . Years of education: Not on file  . Highest education level: Not on file  Occupational History  . Occupation: Accountant  Tobacco Use  . Smoking status: Never Smoker  . Smokeless tobacco: Never Used  Vaping Use  . Vaping Use: Never used  Substance and Sexual Activity  . Alcohol use: No  . Drug use: No  . Sexual activity: Not Currently    Birth control/protection: Surgical, Post-menopausal    Comment: tubal  Other Topics Concern  . Not on file  Social History Narrative  . Not on file   Social Determinants of Health   Financial Resource Strain: Low Risk   . Difficulty of Paying Living Expenses: Not hard at all  Food Insecurity: No Food Insecurity  . Worried About Running Out of Food in the Last Year: Never true  . Ran Out of Food in the Last Year: Never true  Transportation Needs: No Transportation Needs  . Lack of Transportation (Medical): No  . Lack of Transportation (Non-Medical): No  Physical Activity: Insufficiently Active  . Days of Exercise per Week: 3 days  . Minutes of Exercise per Session: 30 min  Stress: No Stress Concern Present  . Feeling of Stress : Not at all  Social Connections: Socially Integrated  . Frequency of Communication with Friends and Family: Twice a week  . Frequency of Social Gatherings with Friends and Family: Once a week  . Attends Religious Services: More than 4 times per year  . Active Member of Clubs or Organizations: Yes  . Attends Club or Organization Meetings: More than 4 times per year  . Marital  Status: Married  Intimate Partner Violence: Not At Risk  . Fear of Current or Ex-Partner: No  . Emotionally Abused: No  . Physically Abused: No  . Sexually Abused: No    Past Surgical Hx:  Past Surgical History:  Procedure Laterality Date  . TUBAL LIGATION  1990    Past Medical Hx:  Past Medical History:  Diagnosis Date  . Allergic rhinitis   . Diabetes mellitus without complication (HCC) 04/08/2017   Referred to RD/CDE  . Vitamin D deficiency 04/08/2017    Past Gynecological History:  See HPI, P1 (SVD). No LMP recorded. Patient is postmenopausal.  Family Hx:  Family History  Problem Relation Age of Onset  . Asthma Other        multiple relatives  . Allergies Other        multiple relatives  . Diabetes Paternal Grandfather   . Hypertension Paternal Grandfather   . Heart disease Paternal Grandfather   . Diabetes Paternal Grandmother   . Hypertension Paternal Grandmother   . Heart disease Paternal Grandmother   . Diabetes Maternal Grandmother   . Hypertension Maternal Grandmother   .   Heart disease Maternal Grandmother   . Diabetes Maternal Grandfather   . Hypertension Maternal Grandfather   . Heart disease Maternal Grandfather   . Diabetes Father   . Hypertension Father   . Heart disease Father   . Hypertension Mother   . Diabetes Mother   . Heart disease Mother   . Cancer Brother        leukemia    Review of Systems:  Constitutional  Feels well,    ENT Normal appearing ears and nares bilaterally Skin/Breast  No rash, sores, jaundice, itching, dryness Cardiovascular  No chest pain, shortness of breath, or edema  Pulmonary  No cough or wheeze.  Gastro Intestinal  No nausea, vomitting, or diarrhoea. No bright red blood per rectum, no abdominal pain, change in bowel movement, or constipation.  Genito Urinary  No frequency, urgency, dysuria, + postmenopausal bleeding Musculo Skeletal  No myalgia, arthralgia, joint swelling or pain  Neurologic  No  weakness, numbness, change in gait,  Psychology  No depression, anxiety, insomnia.   Vitals:  Blood pressure (!) 147/65, pulse 85, temperature 98.5 F (36.9 C), temperature source Oral, resp. rate 16, height 5' 4" (1.626 m), weight (!) 213 lb 4 oz (96.7 kg), SpO2 98 %.  Physical Exam: WD in NAD Neck  Supple NROM, without any enlargements.  Lymph Node Survey No cervical supraclavicular or inguinal adenopathy Cardiovascular  Pulse normal rate, regularity and rhythm. S1 and S2 normal.  Lungs  Clear to auscultation bilateraly, without wheezes/crackles/rhonchi. Good air movement.  Skin  No rash/lesions/breakdown  Psychiatry  Alert and oriented to person, place, and time  Abdomen  Normoactive bowel sounds, abdomen soft, non-tender and obese without evidence of hernia.  Back No CVA tenderness Genito Urinary  Vulva/vagina: Normal external female genitalia.  No lesions. No discharge or bleeding.  Bladder/urethra:  No lesions or masses, well supported bladder  Vagina: normal  Cervix: Normal appearing, no lesions.  Uterus:  Small, mobile, no parametrial involvement or nodularity.  Adnexa: no palpable masses. Rectal  deferred Extremities  No bilateral cyanosis, clubbing or edema.   Karla Pavone C Stefanny Pieri, MD  06/12/2020, 10:25 AM     

## 2020-06-14 ENCOUNTER — Telehealth: Payer: Self-pay | Admitting: Adult Health

## 2020-06-14 NOTE — Telephone Encounter (Signed)
Called pt to ofer support on up coming surgery and she ask about [place] for 8/4, will cancel that Surgery 06/22/20

## 2020-06-19 NOTE — Patient Instructions (Signed)
DUE TO COVID-19 ONLY ONE VISITOR IS ALLOWED TO COME WITH YOU AND STAY IN THE WAITING ROOM ONLY DURING PRE OP AND PROCEDURE DAY OF SURGERY. THE 2 VISITORS MAY VISIT WITH YOU AFTER SURGERY IN YOUR PRIVATE ROOM DURING VISITING HOURS ONLY!   ONCE YOUR COVID TEST IS COMPLETED,  PLEASE BEGIN THE QUARANTINE INSTRUCTIONS AS OUTLINED IN YOUR HANDOUT.                Molly Weeks   Your procedure is scheduled on: 06/22/20   Report to [facility] Main  Entrance   Report to admitting at   9:15 AM     Call this number if you have problems the morning of surgery [phone]    Remember: Do not eat food after Midnight.  You may have clear liquids until 8:00 AM    CLEAR LIQUID DIET   Foods Allowed                                                                     Foods Excluded  Coffee and tea, regular and decaf                             liquids that you cannot  Plain Jell-O any favor except red or purple                                           see through such as: Fruit ices (not with fruit pulp)                                     milk, soups, orange juice  Iced Popsicles                                    All solid food Carbonated beverages, regular and diet                                    Cranberry, grape and apple juices Sports drinks like Gatorade Lightly seasoned clear broth or consume(fat free) Sugar, honey syrup   BRUSH YOUR TEETH MORNING OF SURGERY AND RINSE YOUR MOUTH OUT, NO CHEWING GUM CANDY OR MINTS.     Take these medicines the morning of surgery with A SIP OF WATER: None  DO NOT TAKE ANY DIABETIC MEDICATIONS DAY OF YOUR SURGERY                               You may not have any metal on your body including hair pins and              piercings  Do not wear jewelry, make-up, lotions, powders or perfumes, deodorant             Do not wear nail polish on your fingernails.  Do not shave  48 hours prior to surgery.     Do not bring valuables to the  hospital. Molly Weeks.  Contacts, dentures or bridgework may not be worn into surgery.      Patients discharged the day of surgery will not be allowed to drive home.   IF YOU ARE HAVING SURGERY AND GOING HOME THE SAME DAY, YOU MUST HAVE AN ADULT TO DRIVE YOU HOME AND BE WITH YOU FOR 24 HOURS.   YOU MAY GO HOME BY TAXI OR UBER OR ORTHERWISE, BUT AN ADULT MUST ACCOMPANY YOU HOME AND STAY WITH YOU FOR 24 HOURS.  Name and phone number of your driver:  Special Instructions: N/A              Please read over the following fact sheets you were given: _____________________________________________________________________             [facility] - Preparing for Surgery Before surgery, you can play an important role .  Because skin is not sterile, your skin needs to be as free of germs as possible.   You can reduce the number of germs on your skin by washing with CHG (chlorahexidine gluconate) soap before surgery.   CHG is an antiseptic cleaner which kills germs and bonds with the skin to continue killing germs even after washing. Please DO NOT use if you have an allergy to CHG or antibacterial soaps.   If your skin becomes reddened/irritated stop using the CHG and inform your nurse when you arrive at Short Stay. Do not shave (including legs and underarms) for at least 48 hours prior to the first CHG shower.    Please follow these instructions carefully:  1.  Shower with CHG Soap the night before surgery and the  morning of Surgery.  2.  If you choose to wash your hair, wash your hair first as usual with your  normal  shampoo.  3.  After you shampoo, rinse your hair and body thoroughly to remove the  shampoo.                                        4.  Use CHG as you would any other liquid soap.  You can apply chg directly  to the skin and wash                       Gently with a scrungie or clean washcloth.  5.  Apply the CHG Soap to your body  ONLY FROM THE NECK DOWN.   Do not use on face/ open                           Wound or open sores. Avoid contact with eyes, ears mouth and genitals (private parts).                       Wash face,  Genitals (private parts) with your normal soap.             6.  Wash thoroughly, paying special attention to the area where your surgery  will be performed.  7.  Thoroughly rinse your body with warm water from the neck down.  8.  DO  NOT shower/wash with your normal soap after using and rinsing off  the CHG Soap.             9.  Pat yourself dry with a clean towel.            10.  Wear clean pajamas.            11.  Place clean sheets on your bed the night of your first shower and do not  sleep with pets. Day of Surgery : Do not apply any lotions/deodorants the morning of surgery.  Please wear clean clothes to the hospital/surgery center.  FAILURE TO FOLLOW THESE INSTRUCTIONS MAY RESULT IN THE CANCELLATION OF YOUR SURGERY PATIENT SIGNATURE_________________________________  NURSE SIGNATURE__________________________________  ________________________________________________________________________   Molly Weeks  An incentive spirometer is a tool that can help keep your lungs clear and active. This tool measures how well you are filling your lungs with each breath. Taking long deep breaths may help reverse or decrease the chance of developing breathing (pulmonary) problems (especially infection) following:  A long period of time when you are unable to move or be active. BEFORE THE PROCEDURE   If the spirometer includes an indicator to show your best effort, your nurse or respiratory therapist will set it to a desired goal.  If possible, sit up straight or lean slightly forward. Try not to slouch.  Hold the incentive spirometer in an upright position. INSTRUCTIONS FOR USE  1. Sit on the edge of your bed if possible, or sit up as far as you can in bed or on a chair. 2. Hold the  incentive spirometer in an upright position. 3. Breathe out normally. 4. Place the mouthpiece in your mouth and seal your lips tightly around it. 5. Breathe in slowly and as deeply as possible, raising the piston or the ball toward the top of the column. 6. Hold your breath for 3-5 seconds or for as long as possible. Allow the piston or ball to fall to the bottom of the column. 7. Remove the mouthpiece from your mouth and breathe out normally. 8. Rest for a few seconds and repeat Steps 1 through 7 at least 10 times every 1-2 hours when you are awake. Take your time and take a few normal breaths between deep breaths. 9. The spirometer may include an indicator to show your best effort. Use the indicator as a goal to work toward during each repetition. 10. After each set of 10 deep breaths, practice coughing to be sure your lungs are clear. If you have an incision (the cut made at the time of surgery), support your incision when coughing by placing a pillow or rolled up towels firmly against it. Once you are able to get out of bed, walk around indoors and cough well. You may stop using the incentive spirometer when instructed by your caregiver.  RISKS AND COMPLICATIONS  Take your time so you do not get dizzy or light-headed.  If you are in pain, you may need to take or ask for pain medication before doing incentive spirometry. It is harder to take a deep breath if you are having pain. AFTER USE  Rest and breathe slowly and easily.  It can be helpful to keep track of a log of your progress. Your caregiver can provide you with a simple table to help with this. If you are using the spirometer at home, follow these instructions: Waterproof IF:   You are having difficultly using the spirometer.  You have trouble using the spirometer as often as instructed.  Your pain medication is not giving enough relief while using the spirometer.  You develop fever of 100.5 F (38.1 C) or  higher. SEEK IMMEDIATE MEDICAL CARE IF:   You cough up bloody sputum that had not been present before.  You develop fever of 102 F (38.9 C) or greater.  You develop worsening pain at or near the incision site. MAKE SURE YOU:   Understand these instructions.  Will watch your condition.  Will get help right away if you are not doing well or get worse. Document Released: 03/17/2007 Document Revised: 01/27/2012 Document Reviewed: 05/18/2007 Exit[employer] Patient Information 2014 [employer], [place].   ________________________________________________________________________  WHAT IS A BLOOD TRANSFUSION? Blood Transfusion Information  A transfusion is the replacement of blood or some of its parts. Blood is made up of multiple cells which provide different functions.  Red blood cells carry oxygen and are used for blood loss replacement.  White blood cells fight against infection.  Platelets control bleeding.  Plasma helps clot blood.  Other blood products are available for specialized needs, such as hemophilia or other clotting disorders. BEFORE THE TRANSFUSION  Who gives blood for transfusions?   Healthy volunteers who are fully evaluated to make sure their blood is safe. This is blood bank blood. Transfusion therapy is the safest it has ever been in the practice of medicine. Before blood is taken from a donor, a complete history is taken to make sure that person has no history of diseases nor engages in risky social behavior (examples are intravenous drug use or sexual activity with multiple partners). The donor's travel history is screened to minimize risk of transmitting infections, such as malaria. The donated blood is tested for signs of infectious diseases, such as HIV and hepatitis. The blood is then tested to be sure it is compatible with you in order to minimize the chance of a transfusion reaction. If you or a relative donates blood, this is often done in anticipation of surgery  and is not appropriate for emergency situations. It takes many days to process the donated blood. RISKS AND COMPLICATIONS Although transfusion therapy is very safe and saves many lives, the main dangers of transfusion include:   Getting an infectious disease.  Developing a transfusion reaction. This is an allergic reaction to something in the blood you were given. Every precaution is taken to prevent this. The decision to have a blood transfusion has been considered carefully by your caregiver before blood is given. Blood is not given unless the benefits outweigh the risks. AFTER THE TRANSFUSION  Right after receiving a blood transfusion, you will usually feel much better and more energetic. This is especially true if your red blood cells have gotten low (anemic). The transfusion raises the level of the red blood cells which carry oxygen, and this usually causes an energy increase.  The nurse administering the transfusion will monitor you carefully for complications. HOME CARE INSTRUCTIONS  No special instructions are needed after a transfusion. You may find your energy is better. Speak with your caregiver about any limitations on activity for underlying diseases you may have. SEEK MEDICAL CARE IF:   Your condition is not improving after your transfusion.  You develop redness or irritation at the intravenous (IV) site. SEEK IMMEDIATE MEDICAL CARE IF:  Any of the following symptoms occur over the next 12 hours:  Shaking chills.  You have a temperature by mouth above 102 F (38.9 C), not  controlled by medicine.  Chest, back, or muscle pain.  People around you feel you are not acting correctly or are confused.  Shortness of breath or difficulty breathing.  Dizziness and fainting.  You get a rash or develop hives.  You have a decrease in urine output.  Your urine turns a dark color or changes to pink, red, or brown. Any of the following symptoms occur over the next 10  days:  You have a temperature by mouth above 102 F (38.9 C), not controlled by medicine.  Shortness of breath.  Weakness after normal activity.  The white part of the eye turns yellow (jaundice).  You have a decrease in the amount of urine or are urinating less often.  Your urine turns a dark color or changes to pink, red, or brown. Document Released: 11/01/2000 Document Revised: 01/27/2012 Document Reviewed: 06/20/2008 [facility] Patient Information 2014 [place], [place].  _______________________________________________________________________

## 2020-06-20 ENCOUNTER — Other Ambulatory Visit (HOSPITAL_COMMUNITY)
Admission: RE | Admit: 2020-06-20 | Discharge: 2020-06-20 | Disposition: A | Discharge status: Home / Self Care | Payer: BC Managed Care – PPO | Source: Ambulatory Visit | Attending: Gynecologic Oncology | Admitting: Gynecologic Oncology

## 2020-06-20 ENCOUNTER — Encounter (HOSPITAL_COMMUNITY)
Admission: RE | Admit: 2020-06-20 | Discharge: 2020-06-20 | Disposition: A | Discharge status: Home / Self Care | Payer: BC Managed Care – PPO | Source: Ambulatory Visit | Attending: Gynecologic Oncology | Admitting: Gynecologic Oncology

## 2020-06-20 ENCOUNTER — Encounter (HOSPITAL_COMMUNITY): Payer: Self-pay

## 2020-06-20 ENCOUNTER — Other Ambulatory Visit: Payer: Self-pay

## 2020-06-20 DIAGNOSIS — Z20822 Contact with and (suspected) exposure to covid-19: Secondary | ICD-10-CM | POA: Insufficient documentation

## 2020-06-20 DIAGNOSIS — Z01812 Encounter for preprocedural laboratory examination: Secondary | ICD-10-CM | POA: Insufficient documentation

## 2020-06-20 HISTORY — DX: Malignant (primary) neoplasm, unspecified: C80.1

## 2020-06-20 LAB — COMPREHENSIVE METABOLIC PANEL
ALT: 41 U/L (ref 0–44)
AST: 30 U/L (ref 15–41)
Albumin: 4.3 g/dL (ref 3.5–5.0)
Alkaline Phosphatase: 84 U/L (ref 38–126)
Anion gap: 11 (ref 5–15)
BUN: 16 mg/dL (ref 8–23)
CO2: 24 mmol/L (ref 22–32)
Calcium: 8.9 mg/dL (ref 8.9–10.3)
Chloride: 105 mmol/L (ref 98–111)
Creatinine, Ser: 0.71 mg/dL (ref 0.44–1.00)
GFR calc Af Amer: >60 mL/min (ref >60)
GFR calc non Af Amer: >60 mL/min (ref >60)
Glucose, Bld: 132 mg/dL — ABNORMAL HIGH (ref 70–99)
Potassium: 4.2 mmol/L (ref 3.5–5.1)
Sodium: 140 mmol/L (ref 135–145)
Total Bilirubin: 0.6 mg/dL (ref 0.3–1.2)
Total Protein: 7.3 g/dL (ref 6.5–8.1)

## 2020-06-20 LAB — URINALYSIS, ROUTINE W REFLEX MICROSCOPIC
Bilirubin Urine: NEGATIVE
Glucose, UA: NEGATIVE mg/dL
Hgb urine dipstick: NEGATIVE
Ketones, ur: NEGATIVE mg/dL
Nitrite: NEGATIVE
Protein, ur: NEGATIVE mg/dL
Specific Gravity, Urine: 1.023 (ref 1.005–1.030)
pH: 5 (ref 5.0–8.0)

## 2020-06-20 LAB — CBC
HCT: 43.8 % (ref 36.0–46.0)
Hemoglobin: 14.6 g/dL (ref 12.0–15.0)
MCH: 32.4 pg (ref 26.0–34.0)
MCHC: 33.3 g/dL (ref 30.0–36.0)
MCV: 97.1 fL (ref 80.0–100.0)
Platelets: 271 10*3/uL (ref 150–400)
RBC: 4.51 MIL/uL (ref 3.87–5.11)
RDW: 12 % (ref 11.5–15.5)
WBC: 7.8 10*3/uL (ref 4.0–10.5)
nRBC: 0 % (ref 0.0–0.2)

## 2020-06-20 LAB — HEMOGLOBIN A1C
Hgb A1c MFr Bld: 6 % — ABNORMAL HIGH (ref 4.8–5.6)
Mean Plasma Glucose: 125.5 mg/dL

## 2020-06-20 LAB — SARS CORONAVIRUS 2 (TAT 6-24 HRS): SARS Coronavirus 2: NEGATIVE

## 2020-06-20 NOTE — Progress Notes (Signed)
COVID Vaccine Completed:Yes Date COVID Vaccine completed:03/03/20 COVID vaccine manufacturer:  [employer]     PCP - Dr.L. Flasher Cardiologist - no  Chest x-ray - 2019 EKG - 06/20/20 Stress Test - no ECHO - no Cardiac Cath - no  Sleep Study -  CPAP -   Fasting Blood Sugar - 70-90 Checks Blood Sugar _____ times a day twice a month  Blood Thinner Instructions:NA Aspirin Instructions: Last Dose:  Anesthesia review:   Patient denies shortness of breath, fever, cough and chest pain at PAT appointment Yes   Patient verbalized understanding of instructions that were given to them at the PAT appointment. Patient was also instructed that they will need to review over the PAT instructions again at home before surgery. Yes  Pt has lost Wt and was working ou tuntil the last few months. She denies SOB climbing stairs, doing housework or with ADLs.  She is a diet controlled diabetic.

## 2020-06-21 ENCOUNTER — Other Ambulatory Visit: Payer: BC Managed Care – PPO

## 2020-06-21 ENCOUNTER — Telehealth: Payer: Self-pay

## 2020-06-21 NOTE — Telephone Encounter (Signed)
Spoke with pt to see if she had any questions regarding pre op instructions.  Pt's husband involved in conversation and had question about keeping her safe from Covid and going into detail as to what he is doing to help.  He is concerned for her safety.  They both understood instructions without further questions.

## 2020-06-22 ENCOUNTER — Ambulatory Visit (HOSPITAL_COMMUNITY): Payer: BC Managed Care – PPO | Admitting: Certified Registered"

## 2020-06-22 ENCOUNTER — Encounter (HOSPITAL_COMMUNITY): Payer: Self-pay | Admitting: Gynecologic Oncology

## 2020-06-22 ENCOUNTER — Other Ambulatory Visit: Payer: Self-pay

## 2020-06-22 ENCOUNTER — Ambulatory Visit (HOSPITAL_COMMUNITY): Payer: BC Managed Care – PPO | Admitting: Physician Assistant

## 2020-06-22 ENCOUNTER — Encounter (HOSPITAL_COMMUNITY)
Admission: RE | Disposition: A | Discharge status: Home / Self Care | Payer: Self-pay | Source: Home / Self Care | Attending: Gynecologic Oncology

## 2020-06-22 ENCOUNTER — Ambulatory Visit (HOSPITAL_COMMUNITY)
Admission: RE | Admit: 2020-06-22 | Discharge: 2020-06-22 | Disposition: A | Discharge status: Home / Self Care | Payer: BC Managed Care – PPO | Attending: Gynecologic Oncology | Admitting: Gynecologic Oncology

## 2020-06-22 DIAGNOSIS — Z806 Family history of leukemia: Secondary | ICD-10-CM | POA: Insufficient documentation

## 2020-06-22 DIAGNOSIS — Z888 Allergy status to other drugs, medicaments and biological substances status: Secondary | ICD-10-CM | POA: Diagnosis not present

## 2020-06-22 DIAGNOSIS — C541 Malignant neoplasm of endometrium: Secondary | ICD-10-CM | POA: Diagnosis not present

## 2020-06-22 DIAGNOSIS — N800 Endometriosis of uterus: Secondary | ICD-10-CM | POA: Insufficient documentation

## 2020-06-22 DIAGNOSIS — E559 Vitamin D deficiency, unspecified: Secondary | ICD-10-CM | POA: Diagnosis not present

## 2020-06-22 DIAGNOSIS — Z79899 Other long term (current) drug therapy: Secondary | ICD-10-CM | POA: Insufficient documentation

## 2020-06-22 DIAGNOSIS — E669 Obesity, unspecified: Secondary | ICD-10-CM | POA: Insufficient documentation

## 2020-06-22 DIAGNOSIS — Z808 Family history of malignant neoplasm of other organs or systems: Secondary | ICD-10-CM | POA: Insufficient documentation

## 2020-06-22 DIAGNOSIS — Z881 Allergy status to other antibiotic agents status: Secondary | ICD-10-CM | POA: Diagnosis not present

## 2020-06-22 DIAGNOSIS — E119 Type 2 diabetes mellitus without complications: Secondary | ICD-10-CM | POA: Diagnosis not present

## 2020-06-22 DIAGNOSIS — J309 Allergic rhinitis, unspecified: Secondary | ICD-10-CM | POA: Diagnosis not present

## 2020-06-22 DIAGNOSIS — Z6836 Body mass index (BMI) 36.0-36.9, adult: Secondary | ICD-10-CM | POA: Diagnosis not present

## 2020-06-22 HISTORY — PX: SENTINEL NODE BIOPSY: SHX6608

## 2020-06-22 HISTORY — PX: ROBOTIC ASSISTED TOTAL HYSTERECTOMY WITH BILATERAL SALPINGO OOPHERECTOMY: SHX6086

## 2020-06-22 LAB — TYPE AND SCREEN
ABO/RH(D): O POS
Antibody Screen: NEGATIVE

## 2020-06-22 LAB — ABO/RH: ABO/RH(D): O POS

## 2020-06-22 LAB — GLUCOSE, CAPILLARY: Glucose-Capillary: 143 mg/dL — ABNORMAL HIGH (ref 70–99)

## 2020-06-22 SURGERY — HYSTERECTOMY, TOTAL, ROBOT-ASSISTED, LAPAROSCOPIC, WITH BILATERAL SALPINGO-OOPHORECTOMY
Anesthesia: General | Site: Abdomen

## 2020-06-22 MED ORDER — DEXAMETHASONE SODIUM PHOSPHATE 4 MG/ML IJ SOLN
4.0000 mg | INTRAMUSCULAR | Status: AC
  Administered 2020-06-22: 5 mg via INTRAVENOUS

## 2020-06-22 MED ORDER — ROCURONIUM BROMIDE 10 MG/ML (PF) SYRINGE
PREFILLED_SYRINGE | INTRAVENOUS | Status: AC
  Filled 2020-06-22: qty 10

## 2020-06-22 MED ORDER — SODIUM CHLORIDE 0.9% FLUSH: 3.0000 mL | Freq: Two times a day (BID) | INTRAVENOUS | Status: DC

## 2020-06-22 MED ORDER — ORAL CARE MOUTH RINSE: 15.0000 mL | Freq: Once | OROMUCOSAL | Status: AC

## 2020-06-22 MED ORDER — OXYCODONE HCL 5 MG PO TABS: 5.0000 mg | ORAL_TABLET | ORAL | Status: DC | PRN

## 2020-06-22 MED ORDER — MIDAZOLAM HCL 5 MG/5ML IJ SOLN
INTRAMUSCULAR | Status: DC | PRN
  Administered 2020-06-22: 2 mg via INTRAVENOUS

## 2020-06-22 MED ORDER — BUPIVACAINE HCL 0.25 % IJ SOLN
INTRAMUSCULAR | Status: AC
  Filled 2020-06-22: qty 1

## 2020-06-22 MED ORDER — LACTATED RINGERS IV SOLN: INTRAVENOUS | Status: DC

## 2020-06-22 MED ORDER — SCOPOLAMINE 1 MG/3DAYS TD PT72
1.0000 | MEDICATED_PATCH | TRANSDERMAL | Status: DC
  Administered 2020-06-22: 1.5 mg via TRANSDERMAL
  Filled 2020-06-22: qty 1

## 2020-06-22 MED ORDER — OXYCODONE HCL 5 MG PO TABS: 5.0000 mg | ORAL_TABLET | Freq: Once | ORAL | Status: DC | PRN

## 2020-06-22 MED ORDER — GABAPENTIN 300 MG PO CAPS
300.0000 mg | ORAL_CAPSULE | ORAL | Status: AC
  Administered 2020-06-22: 300 mg via ORAL
  Filled 2020-06-22: qty 1

## 2020-06-22 MED ORDER — SODIUM CHLORIDE 0.9 % IV SOLN: 250.0000 mL | INTRAVENOUS | Status: DC | PRN

## 2020-06-22 MED ORDER — CHLORHEXIDINE GLUCONATE 0.12 % MT SOLN
15.0000 mL | Freq: Once | OROMUCOSAL | Status: AC
  Administered 2020-06-22: 15 mL via OROMUCOSAL

## 2020-06-22 MED ORDER — ACETAMINOPHEN 500 MG PO TABS
1000.0000 mg | ORAL_TABLET | ORAL | Status: AC
  Administered 2020-06-22: 1000 mg via ORAL
  Filled 2020-06-22: qty 2

## 2020-06-22 MED ORDER — PROPOFOL 10 MG/ML IV BOLUS
INTRAVENOUS | Status: AC
  Filled 2020-06-22: qty 20

## 2020-06-22 MED ORDER — ENOXAPARIN SODIUM 40 MG/0.4ML ~~LOC~~ SOLN
40.0000 mg | SUBCUTANEOUS | Status: AC
  Administered 2020-06-22: 40 mg via SUBCUTANEOUS
  Filled 2020-06-22: qty 0.4

## 2020-06-22 MED ORDER — LACTATED RINGERS IR SOLN
Status: DC | PRN
  Administered 2020-06-22: 1000 mL

## 2020-06-22 MED ORDER — CEFAZOLIN SODIUM-DEXTROSE 2-4 GM/100ML-% IV SOLN
2.0000 g | INTRAVENOUS | Status: AC
  Administered 2020-06-22: 2 g via INTRAVENOUS
  Filled 2020-06-22: qty 100

## 2020-06-22 MED ORDER — OXYCODONE HCL 5 MG/5ML PO SOLN: 5.0000 mg | Freq: Once | ORAL | Status: DC | PRN

## 2020-06-22 MED ORDER — PROPOFOL 10 MG/ML IV BOLUS
INTRAVENOUS | Status: DC | PRN
  Administered 2020-06-22: 200 mg via INTRAVENOUS

## 2020-06-22 MED ORDER — DEXAMETHASONE SODIUM PHOSPHATE 10 MG/ML IJ SOLN
INTRAMUSCULAR | Status: AC
  Filled 2020-06-22: qty 1

## 2020-06-22 MED ORDER — ONDANSETRON HCL 4 MG/2ML IJ SOLN
INTRAMUSCULAR | Status: AC
  Filled 2020-06-22: qty 2

## 2020-06-22 MED ORDER — KETOROLAC TROMETHAMINE 30 MG/ML IJ SOLN
INTRAMUSCULAR | Status: AC
  Administered 2020-06-22: 30 mg via INTRAVENOUS
  Filled 2020-06-22: qty 1

## 2020-06-22 MED ORDER — ACETAMINOPHEN 325 MG PO TABS: 650.0000 mg | ORAL_TABLET | ORAL | Status: DC | PRN

## 2020-06-22 MED ORDER — FENTANYL CITRATE (PF) 250 MCG/5ML IJ SOLN
INTRAMUSCULAR | Status: AC
  Filled 2020-06-22: qty 5

## 2020-06-22 MED ORDER — LIDOCAINE 2% (20 MG/ML) 5 ML SYRINGE
INTRAMUSCULAR | Status: AC
  Filled 2020-06-22: qty 5

## 2020-06-22 MED ORDER — STERILE WATER FOR IRRIGATION IR SOLN
Status: DC | PRN
  Administered 2020-06-22: 1000 mL

## 2020-06-22 MED ORDER — STERILE WATER FOR INJECTION IJ SOLN
INTRAMUSCULAR | Status: DC | PRN
  Administered 2020-06-22: 13 mL via VAGINAL

## 2020-06-22 MED ORDER — CELECOXIB 200 MG PO CAPS
400.0000 mg | ORAL_CAPSULE | ORAL | Status: AC
  Administered 2020-06-22: 400 mg via ORAL
  Filled 2020-06-22: qty 2

## 2020-06-22 MED ORDER — SODIUM CHLORIDE 0.9% FLUSH: 3.0000 mL | INTRAVENOUS | Status: DC | PRN

## 2020-06-22 MED ORDER — BUPIVACAINE HCL 0.25 % IJ SOLN
INTRAMUSCULAR | Status: DC | PRN
  Administered 2020-06-22: 20 mL

## 2020-06-22 MED ORDER — ROCURONIUM BROMIDE 10 MG/ML (PF) SYRINGE
PREFILLED_SYRINGE | INTRAVENOUS | Status: DC | PRN
  Administered 2020-06-22: 70 mg via INTRAVENOUS

## 2020-06-22 MED ORDER — MIDAZOLAM HCL 2 MG/2ML IJ SOLN
INTRAMUSCULAR | Status: AC
  Filled 2020-06-22: qty 2

## 2020-06-22 MED ORDER — ONDANSETRON HCL 4 MG/2ML IJ SOLN
INTRAMUSCULAR | Status: DC | PRN
  Administered 2020-06-22: 4 mg via INTRAVENOUS

## 2020-06-22 MED ORDER — PROMETHAZINE HCL 25 MG/ML IJ SOLN: 6.2500 mg | INTRAMUSCULAR | Status: DC | PRN

## 2020-06-22 MED ORDER — SUGAMMADEX SODIUM 200 MG/2ML IV SOLN
INTRAVENOUS | Status: DC | PRN
  Administered 2020-06-22: 200 mg via INTRAVENOUS

## 2020-06-22 MED ORDER — FENTANYL CITRATE (PF) 250 MCG/5ML IJ SOLN
INTRAMUSCULAR | Status: DC | PRN
  Administered 2020-06-22 (×2): 100 ug via INTRAVENOUS

## 2020-06-22 MED ORDER — MORPHINE SULFATE (PF) 4 MG/ML IV SOLN: 2.0000 mg | INTRAVENOUS | Status: DC | PRN

## 2020-06-22 MED ORDER — STERILE WATER FOR INJECTION IJ SOLN
INTRAMUSCULAR | Status: AC
  Filled 2020-06-22: qty 10

## 2020-06-22 MED ORDER — FENTANYL CITRATE (PF) 100 MCG/2ML IJ SOLN
INTRAMUSCULAR | Status: AC
  Filled 2020-06-22: qty 2

## 2020-06-22 MED ORDER — KETOROLAC TROMETHAMINE 30 MG/ML IJ SOLN: 30.0000 mg | Freq: Once | INTRAMUSCULAR | Status: AC | PRN

## 2020-06-22 MED ORDER — LIDOCAINE 2% (20 MG/ML) 5 ML SYRINGE
INTRAMUSCULAR | Status: DC | PRN
  Administered 2020-06-22: 50 mg via INTRAVENOUS

## 2020-06-22 MED ORDER — FENTANYL CITRATE (PF) 100 MCG/2ML IJ SOLN
25.0000 ug | INTRAMUSCULAR | Status: DC | PRN
  Administered 2020-06-22: 50 ug via INTRAVENOUS

## 2020-06-22 MED ORDER — ACETAMINOPHEN 650 MG RE SUPP
650.0000 mg | RECTAL | Status: DC | PRN
  Filled 2020-06-22: qty 1

## 2020-06-22 SURGICAL SUPPLY — 68 items
ADH SKN CLS APL DERMABOND .7 (GAUZE/BANDAGES/DRESSINGS) ×2
AGENT HMST KT MTR STRL THRMB (HEMOSTASIS)
APL ESCP 34 STRL LF DISP (HEMOSTASIS)
APPLICATOR SURGIFLO ENDO (HEMOSTASIS) IMPLANT
BACTOSHIELD CHG 4% 4OZ (MISCELLANEOUS) ×1
BAG LAPAROSCOPIC 12 15 PORT 16 (BASKET) IMPLANT
BAG RETRIEVAL 12/15 (BASKET)
BAG SPEC RTRVL LRG 6X4 10 (ENDOMECHANICALS)
BLADE SURG SZ10 CARB STEEL (BLADE) IMPLANT
COVER BACK TABLE 60X90IN (DRAPES) ×3 IMPLANT
COVER TIP SHEARS 8 DVNC (MISCELLANEOUS) ×2 IMPLANT
COVER TIP SHEARS 8MM DA VINCI (MISCELLANEOUS) ×3
COVER WAND RF STERILE (DRAPES) IMPLANT
DECANTER SPIKE VIAL GLASS SM (MISCELLANEOUS) ×4 IMPLANT
DERMABOND ADVANCED (GAUZE/BANDAGES/DRESSINGS) ×1
DERMABOND ADVANCED .7 DNX12 (GAUZE/BANDAGES/DRESSINGS) ×2 IMPLANT
DRAPE ARM DVNC X/XI (DISPOSABLE) ×8 IMPLANT
DRAPE COLUMN DVNC XI (DISPOSABLE) ×2 IMPLANT
DRAPE DA VINCI XI ARM (DISPOSABLE) ×12
DRAPE DA VINCI XI COLUMN (DISPOSABLE) ×3
DRAPE SHEET LG 3/4 BI-LAMINATE (DRAPES) ×3 IMPLANT
DRAPE SURG IRRIG POUCH 19X23 (DRAPES) ×3 IMPLANT
DRSG OPSITE POSTOP 4X6 (GAUZE/BANDAGES/DRESSINGS) IMPLANT
DRSG OPSITE POSTOP 4X8 (GAUZE/BANDAGES/DRESSINGS) IMPLANT
ELECT REM PT RETURN 15FT ADLT (MISCELLANEOUS) ×3 IMPLANT
GLOVE BIO SURGEON STRL SZ 6 (GLOVE) ×12 IMPLANT
GLOVE BIO SURGEON STRL SZ 6.5 (GLOVE) ×6 IMPLANT
GOWN STRL REUS W/ TWL LRG LVL3 (GOWN DISPOSABLE) ×8 IMPLANT
GOWN STRL REUS W/TWL LRG LVL3 (GOWN DISPOSABLE) ×12
HOLDER FOLEY CATH W/STRAP (MISCELLANEOUS) ×3 IMPLANT
IRRIG SUCT STRYKERFLOW 2 WTIP (MISCELLANEOUS) ×3
IRRIGATION SUCT STRKRFLW 2 WTP (MISCELLANEOUS) ×2 IMPLANT
KIT PROCEDURE DA VINCI SI (MISCELLANEOUS) ×3
KIT PROCEDURE DVNC SI (MISCELLANEOUS) ×1 IMPLANT
KIT TURNOVER KIT A (KITS) IMPLANT
MANIPULATOR UTERINE 4.5 ZUMI (MISCELLANEOUS) ×3 IMPLANT
NDL SPNL 18GX3.5 QUINCKE PK (NEEDLE) IMPLANT
NEEDLE HYPO 22GX1.5 SAFETY (NEEDLE) ×3 IMPLANT
NEEDLE SPNL 18GX3.5 QUINCKE PK (NEEDLE) IMPLANT
OBTURATOR OPTICAL STANDARD 8MM (TROCAR) ×3
OBTURATOR OPTICAL STND 8 DVNC (TROCAR) ×2
OBTURATOR OPTICALSTD 8 DVNC (TROCAR) ×2 IMPLANT
PACK ROBOT GYN CUSTOM WL (TRAY / TRAY PROCEDURE) ×3 IMPLANT
PAD POSITIONING PINK XL (MISCELLANEOUS) ×3 IMPLANT
PENCIL SMOKE EVACUATOR (MISCELLANEOUS) IMPLANT
PORT ACCESS TROCAR AIRSEAL 12 (TROCAR) ×2 IMPLANT
PORT ACCESS TROCAR AIRSEAL 5M (TROCAR) ×1
POUCH SPECIMEN RETRIEVAL 10MM (ENDOMECHANICALS) IMPLANT
SCRUB CHG 4% DYNA-HEX 4OZ (MISCELLANEOUS) ×2 IMPLANT
SEAL CANN UNIV 5-8 DVNC XI (MISCELLANEOUS) ×7 IMPLANT
SEAL XI 5MM-8MM UNIVERSAL (MISCELLANEOUS) ×12
SET TRI-LUMEN FLTR TB AIRSEAL (TUBING) ×3 IMPLANT
SPONGE LAP 18X18 RF (DISPOSABLE) IMPLANT
SURGIFLO W/THROMBIN 8M KIT (HEMOSTASIS) IMPLANT
SUT MNCRL AB 4-0 PS2 18 (SUTURE) IMPLANT
SUT PDS AB 1 TP1 96 (SUTURE) IMPLANT
SUT VIC AB 0 CT1 27 (SUTURE)
SUT VIC AB 0 CT1 27XBRD ANTBC (SUTURE) IMPLANT
SUT VIC AB 2-0 CT1 27 (SUTURE)
SUT VIC AB 2-0 CT1 TAPERPNT 27 (SUTURE) IMPLANT
SUT VICRYL 4-0 PS2 18IN ABS (SUTURE) ×6 IMPLANT
SYR 10ML LL (SYRINGE) IMPLANT
TOWEL OR NON WOVEN STRL DISP B (DISPOSABLE) ×3 IMPLANT
TRAP SPECIMEN MUCUS 40CC (MISCELLANEOUS) IMPLANT
TRAY FOLEY MTR SLVR 16FR STAT (SET/KITS/TRAYS/PACK) ×3 IMPLANT
TROCAR XCEL NON-BLD 5MMX100MML (ENDOMECHANICALS) IMPLANT
UNDERPAD 30X36 HEAVY ABSORB (UNDERPADS AND DIAPERS) ×3 IMPLANT
YANKAUER SUCT BULB TIP 10FT TU (MISCELLANEOUS) IMPLANT

## 2020-06-22 NOTE — Transfer of Care (Signed)
Immediate Anesthesia Transfer of Care Note  Patient: Molly Weeks  Procedure(s) Performed: XI ROBOTIC ASSISTED TOTAL HYSTERECTOMY WITH BILATERAL SALPINGO OOPHORECTOMY (Bilateral Abdomen) SENTINEL NODE BIOPSY (N/A Abdomen)  Patient Location: PACU  Anesthesia Type:General  Level of Consciousness: awake, alert , oriented and patient cooperative  Airway & Oxygen Therapy: Patient Spontanous Breathing and Patient connected to face mask oxygen  Post-op Assessment: Report given to RN, Post -op Vital signs reviewed and stable and Patient moving all extremities  Post vital signs: Reviewed and stable  Last Vitals:  Vitals Value Taken Time  BP    Temp    Pulse 70 06/22/20 1357  Resp 23 06/22/20 1357  SpO2 96 % 06/22/20 1357  Vitals shown include unvalidated device data.  Last Pain:  Vitals:   06/22/20 0935  TempSrc: Oral  PainSc: 0-No pain      Patients Stated Pain Goal: 3 (37/90/24 0973)  Complications: No complications documented.

## 2020-06-22 NOTE — Anesthesia Procedure Notes (Signed)
Procedure Name: Intubation Date/Time: 06/22/2020 12:13 PM Performed by: Myna Bright, CRNA Pre-anesthesia Checklist: Patient identified, Emergency Drugs available, Suction available and Patient being monitored Patient Re-evaluated:Patient Re-evaluated prior to induction Oxygen Delivery Method: Circle system utilized Preoxygenation: Pre-oxygenation with 100% oxygen Induction Type: IV induction Ventilation: Mask ventilation without difficulty Laryngoscope Size: Mac and 3 Grade View: Grade I Tube type: Oral Tube size: 7.0 mm Number of attempts: 1 Airway Equipment and Method: Stylet Placement Confirmation: ETT inserted through vocal cords under direct vision,  positive ETCO2 and breath sounds checked- equal and bilateral Secured at: 21 cm Tube secured with: Tape Dental Injury: Teeth and Oropharynx as per pre-operative assessment

## 2020-06-22 NOTE — Anesthesia Postprocedure Evaluation (Signed)
Anesthesia Post Note  Patient: Molly Weeks  Procedure(s) Performed: XI ROBOTIC ASSISTED TOTAL HYSTERECTOMY WITH BILATERAL SALPINGO OOPHORECTOMY (Bilateral Abdomen) SENTINEL NODE BIOPSY (N/A Abdomen)     Patient location during evaluation: PACU Anesthesia Type: General Level of consciousness: awake and alert Pain management: pain level controlled Vital Signs Assessment: post-procedure vital signs reviewed and stable Respiratory status: spontaneous breathing, nonlabored ventilation and respiratory function stable Cardiovascular status: blood pressure returned to baseline and stable Postop Assessment: no apparent nausea or vomiting Anesthetic complications: no   No complications documented.  Last Vitals:  Vitals:   06/22/20 1445 06/22/20 1500  BP: (!) 156/49 (!) 136/56  Pulse: (!) 55 (!) 56  Resp: 15 15  Temp:    SpO2: 100% 100%    Last Pain:  Vitals:   06/22/20 1500  TempSrc:   PainSc: 0-No pain                 Audry Pili

## 2020-06-22 NOTE — Discharge Instructions (Signed)
06/22/2020  Return to work: 4 weeks  Activity: 1. Be up and out of the bed during the day.  Take a nap if needed.  You may walk up steps but be careful and use the hand rail.  Stair climbing will tire you more than you think, you may need to stop part way and rest.   2. No lifting or straining for 4 weeks.  3. No driving for 1 weeks.  Do Not drive if you are taking narcotic pain medicine.  4. Shower daily.  Use soap and water on your incision and pat dry; don't rub.   5. No sexual activity and nothing in the vagina for 8 weeks.  Medications:  - Take ibuprofen and tylenol first line for pain control. Take these regularly (every 6 hours) to decrease the build up of pain.  - If necessary, for severe pain not relieved by ibuprofen, take percocet.  - While taking percocet you should take sennakot every night to reduce the likelihood of constipation. If this causes diarrhea, stop its use.  Diet: 1. Low sodium Heart Healthy Diet is recommended.  2. It is safe to use a laxative if you have difficulty moving your bowels.   Wound Care: 1. Keep clean and dry.  Shower daily.  Reasons to call the Doctor:   Fever - Oral temperature greater than 100.4 degrees Fahrenheit  Foul-smelling vaginal discharge  Difficulty urinating  Nausea and vomiting  Increased pain at the site of the incision that is unrelieved with pain medicine.  Difficulty breathing with or without chest pain  New calf pain especially if only on one side  Sudden, continuing increased vaginal bleeding with or without clots.   Follow-up: 1. See Everitt Amber in 3 weeks.  Contacts: For questions or concerns you should contact:  Dr. Everitt Amber at [phone] After hours and on week-ends call [phone] and ask to speak to the physician on call for Gynecologic Oncology   After Your Surgery  The information in this section will tell you what to expect after your surgery, both during your stay and after you leave.  You will learn how to safely recover from your surgery. Write down any questions you have and be sure to ask your doctor or nurse.  What to Expect When you wake up after your surgery, you will be in the [facility] (PACU) or your recovery room. A nurse will be monitoring your body temperature, blood pressure, pulse, and oxygen levels. You may have a urinary catheter in your bladder to help monitor the amount of urine you are making. It should come out before you go home. You will also have compression boots on your lower legs to help your circulation. Your pain medication will be given through an IV line or in tablet form. If you are having pain, tell your nurse. Your nurse will tell you how to recover from your surgery. Below are examples of ways you can help yourself recover safely. . You will be encouraged to walk with the help of your nurse or physical therapist. We will give you medication to relieve pain. Walking helps reduce the risk for blood clots and pneumonia. It also helps to stimulate your bowels so they begin working again. . Use your incentive spirometer. This will help your lungs expand, which prevents pneumonia.   Commonly Asked Questions  Will I have pain after surgery? Yes, you will have some pain after your surgery, especially in the first few days.  Your doctor and nurse will ask you about your pain often. You will be given medication to manage your pain as needed. If your pain is not relieved, please tell your doctor or nurse. It is important to control your pain so you can cough, breathe deeply, use your incentive spirometer, and get out of bed and walk.  Will I be able to eat? Yes, you will be able to eat a regular diet or eat as tolerated. You should start with foods that are soft and easy to digest such as apple sauce and chicken noodle soup. Eat small meals frequently, and then advance to regular foods. If you experience bloating, gas, or cramps, limit  high-fiber foods, including whole grain breads and cereal, nuts, seeds, salads, fresh fruit, broccoli, cabbage, and cauliflower. Will I have pain when I am home? The length of time each person has pain or discomfort varies. You may still have some pain when you go home and will probably be taking pain medication. Follow the guidelines below. . Take your medications as directed and as needed. . Call your doctor if the medication prescribed for you doesn't relieve your pain. . Don't drive or drink alcohol while you're taking prescription pain medication. . As your incision heals, you will have less pain and need less pain medication. A mild pain reliever such as acetaminophen (Tylenol) or ibuprofen (Advil) will relieve aches and discomfort. However, large quantities of acetaminophen may be harmful to your liver. Don't take more acetaminophen than the amount directed on the bottle or as instructed by your doctor or nurse. . Pain medication should help you as you resume your normal activities. Take enough medication to do your exercises comfortably. Pain medication is most effective 30 to 45 minutes after taking it. Marland Kitchen Keep track of when you take your pain medication. Taking it when your pain first begins is more effective than waiting for the pain to get worse. Pain medication may cause constipation (having fewer bowel movements than what is normal for you).  How can I prevent constipation? . Go to the bathroom at the same time every day. Your body will get used to going at that time. . If you feel the urge to go, don't put it off. Try to use the bathroom 5 to 15 minutes after meals. . After breakfast is a good time to move your bowels. The reflexes in your colon are strongest at this time. . Exercise, if you can. Walking is an excellent form of exercise. . Drink 8 (8-ounce) glasses (2 liters) of liquids daily, if you can. Drink water, juices, soups, ice cream shakes, and other drinks that don't have  caffeine. Drinks with caffeine, such as coffee and soda, pull fluid out of the body. . Slowly increase the fiber in your diet to 25 to 35 grams per day. Fruits, vegetables, whole grains, and cereals contain fiber. If you have an ostomy or have had recent bowel surgery, check with your doctor or nurse before making any changes in your diet. . Both over-the-counter and prescription medications are available to treat constipation. Start with 1 of the following over-the-counter medications first: o Docusate sodium (Colace) 100 mg. Take ___1__ capsules _2____ times a day. This is a stool softener that causes few side effects. Don't take it with mineral oil. o Polyethylene glycol (MiraLAX) 17 grams daily. o Senna (Senokot) 2 tablets at bedtime. This is a stimulant laxative, which can cause cramping. . If you haven't had a bowel movement in 2  days, call your doctor or nurse.  Can I shower? Yes, you should shower 24 hours after your surgery. Be sure to shower every day. Taking a warm shower is relaxing and can help decrease muscle aches. Use soap when you shower and gently wash your incision. Pat the areas dry with a towel after showering, and leave your incision uncovered (unless there is drainage). Call your doctor if you see any redness or drainage from your incision. Don't take tub baths until you discuss it with your doctor at the first appointment after your surgery. How do I care for my incisions? You will have several small incisions on your abdomen. The incisions are closed with Steri-Strips or Dermabond. You may also have square white dressings on your incisions ([place]). You can remove these in the shower 24 hours after your surgery. You should clean your incisions with soap and water. If you go home with Steri-Strips on your incision, they will loosen and may fall off by themselves. If they haven't fallen off within 10 days, you can remove them. If you go home with Dermabond over your  sutures (stitches), it will also loosen and peel off.  What are the most common symptoms after a hysterectomy? It's common for you to have some vaginal spotting or light bleeding. You should monitor this with a pad or a panty liner. If you have having heavy bleeding (bleeding through a pad or liner every 1 to 2 hours), call your doctor right away. It's also common to have some discomfort after surgery from the air that was pumped into your abdomen during surgery. To help with this, walk, drink plenty of liquids and make sure to take the stool softeners you received.  When is it safe for me to drive? You may resume driving 2 weeks after surgery, as long as you are not taking pain medication that may make you drowsy.  When can I resume sexual activity? Do not place anything in your vagina or have vaginal intercourse for 8 weeks after your surgery. Some people will need to wait longer than 8 weeks, so speak with your doctor before resuming sexual intercourse.  Will I be able to travel? Yes, you can travel. If you are traveling by plane within a few weeks after your surgery, make sure you get up and walk every hour. Be sure to stretch your legs, drink plenty of liquids, and keep your feet elevated when possible.  Will I need any supplies? Most people do not need any supplies after the surgery. In the rare case that you do need supplies, such as tubes or drains, your nurse will order them for you.  When can I return to work? The time it takes to return to work depends on the type of work you do, the type of surgery you had, and how fast your body heals. Most people can return to work about 2 to 4 weeks after the surgery.  What exercises can I do? Exercise will help you gain strength and feel better. Walking and stair climbing are excellent forms of exercise. Gradually increase the distance you walk. Climb stairs slowly, resting or stopping as needed. Ask your doctor or nurse before starting more  strenuous exercises.  When can I lift heavy objects? Most people should not lift anything heavier than 10 pounds (4.5 kilograms) for at least 4 weeks after surgery. Speak with your doctor about when you can do heavy lifting.  How can I cope with my feelings? After surgery  for a serious illness, you may have new and upsetting feelings. Many people say they felt weepy, sad, worried, nervous, irritable, and angry at one time or another. You may find that you can't control some of these feelings. If this happens, it's a good idea to seek emotional support. The first step in coping is to talk about how you feel. Family and friends can help. Your nurse, doctor, and social worker can reassure, support, and guide you. It's always a good idea to let these professionals know how you, your family, and your friends are feeling emotionally. Many resources are available to patients and their families. Whether you're in the hospital or at home, the nurses, doctors, and social workers are here to help you and your family and friends handle the emotional aspects of your illness.  When is my first appointment after surgery? Your first appointment after surgery will be 2 to 4 weeks after surgery. Your nurse will give you instructions on how to make this appointment, including the phone number to call.  What if I have other questions? If you have any questions or concerns, please talk with your doctor or nurse. You can reach them Monday through Friday from 9:00 am to 5:00 pm. After 5:00 pm, during the weekend, and on holidays, call [phone] and ask for the doctor on call for your doctor.  . Have a temperature of 101 F (38.3 C) or higher . Have pain that does not get better with pain medication . Have redness, drainage, or swelling from your incisions

## 2020-06-22 NOTE — Interval H&P Note (Signed)
History and Physical Interval Note:  06/22/2020 10:59 AM  Molly Weeks  has presented today for surgery, with the diagnosis of ENDOMETRIAL CANCER.  The various methods of treatment have been discussed with the patient and family. After consideration of risks, benefits and other options for treatment, the patient has consented to  Procedure(s): XI ROBOTIC ASSISTED TOTAL HYSTERECTOMY WITH BILATERAL SALPINGO OOPHORECTOMY (Bilateral) SENTINEL NODE BIOPSY (N/A) as a surgical intervention.  The patient's history has been reviewed, patient examined, no change in status, stable for surgery.  I have reviewed the patient's chart and labs.  Questions were answered to the patient's satisfaction.     [name]

## 2020-06-22 NOTE — Op Note (Signed)
OPERATIVE NOTE 06/22/20  Surgeon: Donaciano Eva   Assistants: Dr Lahoma Crocker (an MD assistant was necessary for tissue manipulation, management of robotic instrumentation, retraction and positioning due to the complexity of the case and hospital policies).   Anesthesia: General endotracheal anesthesia  ASA Class: 3   Pre-operative Diagnosis: endometrial cancer grade 2  Post-operative Diagnosis: same,   Operation: Robotic-assisted laparoscopic total hysterectomy with bilateral salpingoophorectomy, SLN biopsy   Surgeon: Donaciano Eva  Assistant Surgeon: Lahoma Crocker MD  Anesthesia: GET  Urine Output: 400cc  Operative Findings:  : 7cm normal appearing uterus, normal appearing tubes and ovaries. No suspicious nodes  Estimated Blood Loss:  20cc      Total IV Fluids: 800 ml         Specimens: uterus, cervix, bilateral tubes and ovaries, right obturator SLN, left external iliac SLN, left common iliac SLN         Complications:  None; patient tolerated the procedure well.         Disposition: PACU - hemodynamically stable.  Procedure Details  The patient was seen in the Holding Room. The risks, benefits, complications, treatment options, and expected outcomes were discussed with the patient.  The patient concurred with the proposed plan, giving informed consent.  The site of surgery properly noted/marked. The patient was identified as Molly Weeks and the procedure verified as a Robotic-assisted hysterectomy with bilateral salpingo oophorectomy with SLN biopsy. A Time Out was held and the above information confirmed.  After induction of anesthesia, the patient was draped and prepped in the usual sterile manner. Pt was placed in supine position after anesthesia and draped and prepped in the usual sterile manner. The abdominal drape was placed after the CholoraPrep had been allowed to dry for 3 minutes.  Her arms were tucked to her side with all appropriate  precautions.  The shoulders were stabilized with padded shoulder blocks applied to the acromium processes.  The patient was placed in the semi-lithotomy position in [place].  The perineum was prepped with Betadine. The patient was then prepped. Foley catheter was placed.  A sterile speculum was placed in the vagina.  The cervix was grasped with a single-tooth tenaculum. 2mg  total of ICG was injected into the cervical stroma at 2 and 9 o'clock with 1cc injected at a 1cm and 80mm depth (concentration 0.5mg /ml) in all locations. The cervix was dilated with Kennon Rounds dilators.  The ZUMI uterine manipulator with a medium colpotomizer ring was placed without difficulty.  A pneum occluder balloon was placed over the manipulator.  OG tube placement was confirmed and to suction.   Next, a 5 mm skin incision was made 1 cm below the subcostal margin in the midclavicular line.  The 5 mm Optiview port and scope was used for direct entry.  Opening pressure was under 10 mm CO2.  The abdomen was insufflated and the findings were noted as above.   At this point and all points during the procedure, the patient's intra-abdominal pressure did not exceed 15 mmHg. Next, a 10 mm skin incision was made in the umbilicus and a right and left port was placed about 10 cm lateral to the robot port on the right and left side.  A fourth arm was placed in the left lower quadrant 2 cm above and superior and medial to the anterior superior iliac spine.  All ports were placed under direct visualization.  The patient was placed in steep Trendelenburg.  Bowel was folded away into  the upper abdomen.  The robot was docked in the normal manner.  The right and left peritoneum were opened parallel to the IP ligament to open the retroperitoneal spaces bilaterally. The SLN mapping was performed in bilateral pelvic basins. The para rectal and paravesical spaces were opened up entirely with careful dissection below the level of the ureters bilaterally and  to the depth of the uterine artery origin in order to skeletonize the uterine "web" and ensure visualization of all parametrial channels. The para-aortic basins were carefully exposed and evaluated for isolated para-aortic SLN's. Lymphatic channels were identified travelling to the following visualized sentinel lymph node's: right obturator SLN, left external iliac and common iliac SLN. These SLN's were separated from their surrounding lymphatic tissue, removed and sent for permanent pathology.  The hysterectomy was started after the round ligament on the right side was incised and the retroperitoneum was entered and the pararectal space was developed.  The ureter was noted to be on the medial leaf of the broad ligament.  The peritoneum above the ureter was incised and stretched and the infundibulopelvic ligament was skeletonized, cauterized and cut.  The posterior peritoneum was taken down to the level of the KOH ring.  The anterior peritoneum was also taken down.  The bladder flap was created to the level of the KOH ring.  The uterine artery on the right side was skeletonized, cauterized and cut in the normal manner.  A similar procedure was performed on the left.  The colpotomy was made and the uterus, cervix, bilateral ovaries and tubes were amputated and delivered through the vagina.  Pedicles were inspected and excellent hemostasis was achieved.    The colpotomy at the vaginal cuff was closed with Vicryl on a CT1 needle in a running manner.  Irrigation was used and excellent hemostasis was achieved.  At this point in the procedure was completed.  Robotic instruments were removed under direct visulaization.  The robot was undocked. The 10 mm ports were closed with Vicryl on a UR-5 needle and the fascia was closed with 0 Vicryl on a UR-5 needle.  The skin was closed with 4-0 Vicryl in a subcuticular manner.  Dermabond was applied.  Sponge, lap and needle counts correct x 2.  The patient was taken to the  recovery room in stable condition.  The vagina was swabbed with  minimal bleeding noted.   All instrument and needle counts were correct x  3.   The patient was transferred to the recovery room in a stable condition.  Donaciano Eva, MD

## 2020-06-22 NOTE — Anesthesia Preprocedure Evaluation (Addendum)
Anesthesia Evaluation  Patient identified by MRN, date of birth, ID band Patient awake    Reviewed: Allergy & Precautions, NPO status , Patient's Chart, lab work & pertinent test results  History of Anesthesia Complications Negative for: history of anesthetic complications  Airway Mallampati: II  TM Distance: >3 FB Neck ROM: Full    Dental  (+) Dental Advisory Given, Teeth Intact   Pulmonary neg pulmonary ROS,    Pulmonary exam normal        Cardiovascular negative cardio ROS Normal cardiovascular exam     Neuro/Psych negative neurological ROS  negative psych ROS   GI/Hepatic negative GI ROS, Neg liver ROS,   Endo/Other  diabetes, Well Controlled, Type 2 Obesity   Renal/GU negative Renal ROS     Musculoskeletal negative musculoskeletal ROS (+)   Abdominal   Peds  Hematology negative hematology ROS (+)   Anesthesia Other Findings Covid test negative   Reproductive/Obstetrics  Endometrial cancer                             Anesthesia Physical Anesthesia Plan  ASA: II  Anesthesia Plan: General   Post-op Pain Management:    Induction: Intravenous  PONV Risk Score and Plan: 4 or greater and Treatment may vary due to age or medical condition, Ondansetron, Midazolam, Dexamethasone and Scopolamine patch - Pre-op  Airway Management Planned: Oral ETT  Additional Equipment: None  Intra-op Plan:   Post-operative Plan: Extubation in OR  Informed Consent: I have reviewed the patients History and Physical, chart, labs and discussed the procedure including the risks, benefits and alternatives for the proposed anesthesia with the patient or authorized representative who has indicated his/her understanding and acceptance.     Dental advisory given  Plan Discussed with: CRNA and Anesthesiologist  Anesthesia Plan Comments:        Anesthesia Quick Evaluation

## 2020-06-23 ENCOUNTER — Encounter (HOSPITAL_COMMUNITY): Payer: Self-pay | Admitting: Gynecologic Oncology

## 2020-06-23 ENCOUNTER — Telehealth: Payer: Self-pay

## 2020-06-23 NOTE — Telephone Encounter (Signed)
Ms Sher is eating, drinking, and urinating well. She is not passing gas. Husband could not find senokot-s and got colace.  Told him to ask pharmacist where the senokot is located to pick up. She needs to take 2 tablets with 8 oz of water bid starting today. If no BM by tomorrow afternoon she needs to add miralax 1 capful bid. Afebrile. Incisions are D&I. Pt not experiencing any pain. She has not needed to take any medication for pain, ibuprofen or tramadol. Husband aware of the appointments as well as the office number [phone] to call if they have any questions or concerns.

## 2020-06-28 ENCOUNTER — Telehealth: Payer: Self-pay

## 2020-06-28 NOTE — Telephone Encounter (Signed)
Returned call to pt and her husband.  Husband is really concerned about information released in [place].  He is really nervous and was asking for MD who is not in the office today.  Listened to his concerns which were outside of pt surgery as well.  Husband wad assured that MD would call as scheduled for phone visit tomorrow.  He understood and voiced that he and pt would be waiting for MD call.

## 2020-06-29 ENCOUNTER — Telehealth: Payer: Self-pay | Admitting: Adult Health

## 2020-06-29 ENCOUNTER — Inpatient Hospital Stay: Payer: BC Managed Care – PPO | Attending: Gynecologic Oncology | Admitting: Gynecologic Oncology

## 2020-06-29 ENCOUNTER — Encounter: Payer: Self-pay | Admitting: Gynecologic Oncology

## 2020-06-29 DIAGNOSIS — C541 Malignant neoplasm of endometrium: Secondary | ICD-10-CM

## 2020-06-29 DIAGNOSIS — Z7189 Other specified counseling: Secondary | ICD-10-CM

## 2020-06-29 DIAGNOSIS — Z90722 Acquired absence of ovaries, bilateral: Secondary | ICD-10-CM | POA: Insufficient documentation

## 2020-06-29 DIAGNOSIS — Z90710 Acquired absence of both cervix and uterus: Secondary | ICD-10-CM | POA: Insufficient documentation

## 2020-06-29 NOTE — Progress Notes (Signed)
Gynecologic Oncology Telephone Visit  I connected with Molly Weeks on 06/29/20 at  4:00 PM EDT by telephone and verified that I am speaking with the correct person using two identifiers.  I discussed the limitations, risks, security and privacy concerns of performing an evaluation and management service by telemedicine and the availability of in-person appointments. I also discussed with the patient that there may be a patient responsible charge related to this service. The patient expressed understanding and agreed to proceed.  Other persons participating in the visit and their role in the encounter: husband for listening and support.  Patient's location: home Provider's location: [place]  Chief Complaint:  Chief Complaint  Patient presents with   endometrial cancer   counseling and coordination of care    Assessment/Plan:  [name]  is a 64 y.o.  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 S/p robotic staging procedure on 06/22/20.  Pathology revealed low risk factors for recurrence, therefore no adjuvant therapy is recommended according to NCCN guidelines.  I discussed risk for recurrence and typical symptoms encouraged her to notify us of these should they develop between visits.  I recommend she have follow-up every 6 months for 5 years in accordance with NCCN guidelines. Those visits should include symptom assessment, physical exam and pelvic examination. Pap smears are not indicated or recommended in the routine surveillance of endometrial cancer.  HPI: [name] is a 64 year old P1 who was seen in consultation at the request of Dr Molly Weeks and Derrek Monaco NP for evaluation of grade 2 endometrioid endometrial cancer.  The patient reported a history of postmenopausal bleeding that occurred in July, 3 days before her scheduled annual gynecologic visit with Derrek Monaco, the nurse  practitioner who works with Dr. Glo Weeks.  She reported her symptoms to [facility] at that time who performed a Pap smear and recommended an ultrasound.  An endocervical polyp was seen.  The ultrasound was performed on 06/04/2020 and revealed a uterus measuring 6.8 x 3.9 x 5.4 cm with a 16.7 mm thickened endometrium.  The right ovary was not visualized.  The left ovary measured 2 x 2.2 x 1.6 cm.  The following day a biopsy of the cervix (removal of the cervical polyp) and endometrial biopsy were performed on 06/05/2020.  Final pathology from this revealed a benign endocervical type polyp with no malignancy.  Additionally the endometrial biopsy confirmed FIGO grade 2 endometrioid adenocarcinoma.  Her family history is remarkable for a brother with leukemia and multiple myeloma.  Interval Hx:   On 06/22/20 she underwent robotic assisted total hysterectomy, BSO, sentinel lymph node biopsy. Intraoperative findings were significant for a 7 cm normal-appearing uterus, normal tubes and ovaries, no suspicious nodes. Surgery was uncomplicated.  Final pathology revealed a FIGO grade 2 endometrioid adenocarcinoma of the endometrium with less than 1 mm invasion of a total fifth myometrial thickness of 22 mm, representing inner half myometrial invasion, no LVSI, negative sentinel lymph nodes, cervix, and adnexa. MMR and MSI testing pending. She was determined to have low risk features and no adjuvant therapy was recommended in accordance with NCCN guidelines.  Since surgery she has done well with no complaints. She reported not requiring pain medications in the postoperative period.   Current Meds:  Outpatient Encounter Medications as of 06/29/2020  Medication Sig   Cholecalciferol (VITAMIN D3) 50 MCG (2000 UT) TABS Take 2,000 Units by mouth daily.   ibuprofen (ADVIL)  600 MG tablet Take 1 tablet (600 mg total) by mouth every 6 (six) hours as needed for moderate pain. For AFTER surgery only   senna-docusate  (SENOKOT-S) 8.6-50 MG tablet Take 2 tablets by mouth at bedtime. For AFTER surgery, do not take if having diarrhea   traMADol (ULTRAM) 50 MG tablet Take 1 tablet (50 mg total) by mouth every 6 (six) hours as needed for severe pain. For AFTER surgery, do not take and drive   No facility-administered encounter medications on file as of 06/29/2020.    Allergy:  Allergies  Allergen Reactions   Cephalexin Diarrhea and Nausea And Vomiting   Moxifloxacin Rash    fever   Tamiflu [Oseltamivir Phosphate]     Severe nausea right after taking in 2018    Social Hx:   Social History   Socioeconomic History   Marital status: Married    Spouse name: Not on file   Number of children: Not on file   Years of education: Not on file   Highest education level: Not on file  Occupational History   Occupation: Accountant  Tobacco Use   Smoking status: Never Smoker   Smokeless tobacco: Never Used  Scientific laboratory technician Use: Never used  Substance and Sexual Activity   Alcohol use: No   Drug use: No   Sexual activity: Not Currently    Birth control/protection: Surgical, Post-menopausal    Comment: tubal  Other Topics Concern   Not on file  Social History Narrative   Not on file   Social Determinants of Health   Financial Resource Strain: Low Risk    Difficulty of Paying Living Expenses: Not hard at all  Food Insecurity: No Food Insecurity   Worried About Charity fundraiser in the Last Year: Never true   Ran Out of Food in the Last Year: Never true  Transportation Needs: No Transportation Needs   Lack of Transportation (Medical): No   Lack of Transportation (Non-Medical): No  Physical Activity: Insufficiently Active   Days of Exercise per Week: 3 days   Minutes of Exercise per Session: 30 min  Stress: No Stress Concern Present   Feeling of Stress : Not at all  Social Connections: Socially Integrated   Frequency of Communication with Friends and Family: Twice a  week   Frequency of Social Gatherings with Friends and Family: Once a week   Attends Religious Services: More than 4 times per year   Active Member of [employer] or Organizations: Yes   Attends Music therapist: More than 4 times per year   Marital Status: Married  Human resources officer Violence: Not At Risk   Fear of Current or Ex-Partner: No   Emotionally Abused: No   Physically Abused: No   Sexually Abused: No    Past Surgical Hx:  Past Surgical History:  Procedure Laterality Date   ROBOTIC ASSISTED TOTAL HYSTERECTOMY WITH BILATERAL SALPINGO OOPHERECTOMY Bilateral 06/22/2020   Procedure: XI ROBOTIC ASSISTED TOTAL HYSTERECTOMY WITH BILATERAL SALPINGO OOPHORECTOMY;  Surgeon: Everitt Amber, MD;  Location: [facility];  Service: Gynecology;  Laterality: Bilateral;   SENTINEL NODE BIOPSY N/A 06/22/2020   Procedure: SENTINEL NODE BIOPSY;  Surgeon: Everitt Amber, MD;  Location: [facility];  Service: Gynecology;  Laterality: N/A;   TUBAL LIGATION  1990    Past Medical Hx:  Past Medical History:  Diagnosis Date   Allergic rhinitis    Cancer ([place])    Diabetes mellitus without complication ([place])    Vitamin D  deficiency 04/08/2017    Past Gynecological History:  See HPI, P1 (SVD). No LMP recorded. Patient is postmenopausal.  Family Hx:  Family History  Problem Relation Age of Onset   Asthma Other        multiple relatives   Allergies Other        multiple relatives   Diabetes Paternal Grandfather    Hypertension Paternal Grandfather    Heart disease Paternal Grandfather    Diabetes Paternal Grandmother    Hypertension Paternal Grandmother    Heart disease Paternal Grandmother    Diabetes Maternal Grandmother    Hypertension Maternal Grandmother    Heart disease Maternal Grandmother    Diabetes Maternal Grandfather    Hypertension Maternal Grandfather    Heart disease Maternal Grandfather    Diabetes Father    Hypertension Father    Heart disease Father     Hypertension Mother    Diabetes Mother    Heart disease Mother    Cancer Brother        leukemia    Review of Systems:  Constitutional  Feels well,    ENT Normal appearing ears and nares bilaterally Skin/Breast  No rash, sores, jaundice, itching, dryness Cardiovascular  No chest pain, shortness of breath, or edema  Pulmonary  No cough or wheeze.  Gastro Intestinal  No nausea, vomitting, or diarrhoea. No bright red blood per rectum, no abdominal pain, change in bowel movement, or constipation.  Genito Urinary  No frequency, urgency, dysuria, no bleeding Musculo Skeletal  No myalgia, arthralgia, joint swelling or pain  Neurologic  No weakness, numbness, change in gait,  Psychology  No depression, anxiety, insomnia.   Vitals:  There were no vitals taken for this visit.  Physical Exam: Deferred due to phone visit.  I discussed the assessment and treatment plan with the patient. The patient was provided with an opportunity to ask questions and all were answered. The patient agreed with the plan and demonstrated an understanding of the instructions.   The patient was advised to call back or see an in-person evaluation if the symptoms worsen or if the condition fails to improve as anticipated.   I provided 20 minutes of non face-to-face telephone visit time during this encounter, and > 50% was spent counseling as documented under my assessment & plan.  [name], MD  06/29/2020, 4:18 PM

## 2020-06-29 NOTE — Telephone Encounter (Signed)
Called Debbie to tell her Phebe Colla that she does not need any more treatment for endometrial cancer except every 6 month follow up for 5 years and to take care, she was aware of this already and I wanted her to know I had seen her results from Dr Denman George

## 2020-07-06 ENCOUNTER — Encounter (HOSPITAL_COMMUNITY): Payer: Self-pay | Admitting: Gynecologic Oncology

## 2020-07-11 LAB — SURGICAL PATHOLOGY

## 2020-07-14 ENCOUNTER — Encounter: Payer: Self-pay | Admitting: Gynecologic Oncology

## 2020-07-14 ENCOUNTER — Inpatient Hospital Stay (HOSPITAL_BASED_OUTPATIENT_CLINIC_OR_DEPARTMENT_OTHER): Payer: BC Managed Care – PPO | Admitting: Gynecologic Oncology

## 2020-07-14 ENCOUNTER — Other Ambulatory Visit: Payer: Self-pay

## 2020-07-14 VITALS — BP 135/58 | HR 73 | Temp 98.0°F | Resp 18 | Ht 64.0 in | Wt 208.0 lb

## 2020-07-14 DIAGNOSIS — Z7189 Other specified counseling: Secondary | ICD-10-CM

## 2020-07-14 DIAGNOSIS — Z90710 Acquired absence of both cervix and uterus: Secondary | ICD-10-CM | POA: Diagnosis not present

## 2020-07-14 DIAGNOSIS — C541 Malignant neoplasm of endometrium: Secondary | ICD-10-CM

## 2020-07-14 DIAGNOSIS — Z90722 Acquired absence of ovaries, bilateral: Secondary | ICD-10-CM | POA: Diagnosis not present

## 2020-07-14 NOTE — Progress Notes (Signed)
Gynecologic Oncology Follow-up Visit  Chief Complaint:  Chief Complaint  Patient presents with  . Endometrial cancer [facility])    Post Op    Assessment/Plan:  [name]  is a 64 y.o.  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 S/p robotic staging procedure on 06/22/20.  Pathology revealed low risk factors for recurrence, therefore no adjuvant therapy is recommended according to NCCN guidelines.  I discussed risk for recurrence and typical symptoms encouraged her to notify us of these should they develop between visits.  I recommend she have follow-up every 6 months for 5 years in accordance with NCCN guidelines. Those visits should include symptom assessment, physical exam and pelvic examination. Pap smears are not indicated or recommended in the routine surveillance of endometrial cancer.  HPI: [name] is a 64 year old P1 who was seen in consultation at the request of Dr Glo Herring and Derrek Monaco NP for evaluation of grade 2 endometrioid endometrial cancer.  The patient reported a history of postmenopausal bleeding that occurred in July, 3 days before her scheduled annual gynecologic visit with Derrek Monaco, the nurse practitioner who works with Dr. Glo Herring.  She reported her symptoms to [facility] at that time who performed a Pap smear and recommended an ultrasound.  An endocervical polyp was seen.  The ultrasound was performed on 06/04/2020 and revealed a uterus measuring 6.8 x 3.9 x 5.4 cm with a 16.7 mm thickened endometrium.  The right ovary was not visualized.  The left ovary measured 2 x 2.2 x 1.6 cm.  The following day a biopsy of the cervix (removal of the cervical polyp) and endometrial biopsy were performed on 06/05/2020.  Final pathology from this revealed a benign endocervical type polyp with no malignancy.  Additionally the endometrial biopsy confirmed FIGO grade 2 endometrioid adenocarcinoma.  Her  family history is remarkable for a brother with leukemia and multiple myeloma.  Interval Hx:   On 06/22/20 she underwent robotic assisted total hysterectomy, BSO, sentinel lymph node biopsy. Intraoperative findings were significant for a 7 cm normal-appearing uterus, normal tubes and ovaries, no suspicious nodes. Surgery was uncomplicated.  Final pathology revealed a FIGO grade 2 endometrioid adenocarcinoma of the endometrium with less than 1 mm invasion of a total fifth myometrial thickness of 22 mm, representing inner half myometrial invasion, no LVSI, negative sentinel lymph nodes, cervix, and adnexa. MMR and MSI testing pending. She was determined to have low risk features and no adjuvant therapy was recommended in accordance with NCCN guidelines.  Since surgery she has done well with no complaints. She reported not requiring pain medications in the postoperative period.   Current Meds:  Outpatient Encounter Medications as of 07/14/2020  Medication Sig  . Cholecalciferol (VITAMIN D3) 50 MCG (2000 UT) TABS Take 2,000 Units by mouth daily.  Marland Kitchen senna-docusate (SENOKOT-S) 8.6-50 MG tablet Take 2 tablets by mouth at bedtime. For AFTER surgery, do not take if having diarrhea  . [DISCONTINUED] ibuprofen (ADVIL) 600 MG tablet Take 1 tablet (600 mg total) by mouth every 6 (six) hours as needed for moderate pain. For AFTER surgery only (Patient not taking: Reported on 07/14/2020)  . [DISCONTINUED] traMADol (ULTRAM) 50 MG tablet Take 1 tablet (50 mg total) by mouth every 6 (six) hours as needed for severe pain. For AFTER surgery, do not take and drive (Patient not taking: Reported on 07/14/2020)   No facility-administered encounter medications on file as of 07/14/2020.    Allergy:  Allergies  Allergen Reactions  . Cephalexin Diarrhea and Nausea And Vomiting  . Moxifloxacin Rash    fever  . Tamiflu [Oseltamivir Phosphate]     Severe nausea right after taking in 2018    Social Hx:   Social History    Socioeconomic History  . Marital status: Married    Spouse name: Not on file  . Number of children: Not on file  . Years of education: Not on file  . Highest education level: Not on file  Occupational History  . Occupation: Optometrist  Tobacco Use  . Smoking status: Never Smoker  . Smokeless tobacco: Never Used  Vaping Use  . Vaping Use: Never used  Substance and Sexual Activity  . Alcohol use: No  . Drug use: No  . Sexual activity: Not Currently    Birth control/protection: Surgical, Post-menopausal    Comment: tubal  Other Topics Concern  . Not on file  Social History Narrative  . Not on file   Social Determinants of Health   Financial Resource Strain: Low Risk   . Difficulty of Paying Living Expenses: Not hard at all  Food Insecurity: No Food Insecurity  . Worried About Charity fundraiser in the Last Year: Never true  . Ran Out of Food in the Last Year: Never true  Transportation Needs: No Transportation Needs  . Lack of Transportation (Medical): No  . Lack of Transportation (Non-Medical): No  Physical Activity: Insufficiently Active  . Days of Exercise per Week: 3 days  . Minutes of Exercise per Session: 30 min  Stress: No Stress Concern Present  . Feeling of Stress : Not at all  Social Connections: Socially Integrated  . Frequency of Communication with Friends and Family: Twice a week  . Frequency of Social Gatherings with Friends and Family: Once a week  . Attends Religious Services: More than 4 times per year  . Active Member of Clubs or Organizations: Yes  . Attends Archivist Meetings: More than 4 times per year  . Marital Status: Married  Human resources officer Violence: Not At Risk  . Fear of Current or Ex-Partner: No  . Emotionally Abused: No  . Physically Abused: No  . Sexually Abused: No    Past Surgical Hx:  Past Surgical History:  Procedure Laterality Date  . ROBOTIC ASSISTED TOTAL HYSTERECTOMY WITH BILATERAL SALPINGO OOPHERECTOMY  Bilateral 06/22/2020   Procedure: XI ROBOTIC ASSISTED TOTAL HYSTERECTOMY WITH BILATERAL SALPINGO OOPHORECTOMY;  Surgeon: Everitt Amber, MD;  [facility];  Service: Gynecology;  Laterality: Bilateral;  . SENTINEL NODE BIOPSY N/A 06/22/2020   Procedure: SENTINEL NODE BIOPSY;  Surgeon: Everitt Amber, MD;  Location: [facility];  Service: Gynecology;  Laterality: N/A;  . TUBAL LIGATION  1990    Past Medical Hx:  Past Medical History:  Diagnosis Date  . Allergic rhinitis   . Cancer ([place])   . Diabetes mellitus without complication ([place])   . Vitamin D deficiency 04/08/2017    Past Gynecological History:  See HPI, P1 (SVD). No LMP recorded. Patient is postmenopausal.  Family Hx:  Family History  Problem Relation Age of Onset  . Asthma Other        multiple relatives  . Allergies Other        multiple relatives  . Diabetes Paternal Grandfather   . Hypertension Paternal Grandfather   . Heart disease Paternal Grandfather   . Diabetes Paternal Grandmother   . Hypertension Paternal Grandmother   . Heart disease Paternal Grandmother   .  Diabetes Maternal Grandmother   . Hypertension Maternal Grandmother   . Heart disease Maternal Grandmother   . Diabetes Maternal Grandfather   . Hypertension Maternal Grandfather   . Heart disease Maternal Grandfather   . Diabetes Father   . Hypertension Father   . Heart disease Father   . Hypertension Mother   . Diabetes Mother   . Heart disease Mother   . Cancer Brother        leukemia    Review of Systems:  Constitutional  Feels well,    ENT Normal appearing ears and nares bilaterally Skin/Breast  No rash, sores, jaundice, itching, dryness Cardiovascular  No chest pain, shortness of breath, or edema  Pulmonary  No cough or wheeze.  Gastro Intestinal  No nausea, vomitting, or diarrhoea. No bright red blood per rectum, no abdominal pain, change in bowel movement, or constipation.  Genito Urinary  No frequency, urgency, dysuria, no  bleeding Musculo Skeletal  No myalgia, arthralgia, joint swelling or pain  Neurologic  No weakness, numbness, change in gait,  Psychology  No depression, anxiety, insomnia.   Vitals:  Blood pressure (!) 135/58, pulse 73, temperature 98 F (36.7 C), temperature source Tympanic, resp. rate 18, height 5' 4"  (1.626 m), weight 208 lb (94.3 kg), SpO2 98 %.  Physical Exam: WD in NAD Neck  Supple NROM, without any enlargements.  Lymph Node Survey No cervical supraclavicular or inguinal adenopathy Cardiovascular  Well perfused peripheries Lungs  No apparent increased WOB Skin  No rash/lesions/breakdown  Psychiatry  Alert and oriented to person, place, and time  Abdomen  Normoactive bowel sounds, abdomen soft, non-tender and obese without evidence of hernia. Well healed incisions. Genito Urinary  Vulva/vagina: Normal external female genitalia.  No lesions. No discharge or bleeding.  Vagina: well healed cuff, sutures healing, no blood, no lesions.  Rectal  deferred Extremities  No bilateral cyanosis, clubbing or edema.   30 minutes of direct face to face counseling time was spent with the patient. This included discussion about prognosis, therapy recommendations and postoperative side effects and are beyond the scope of routine postoperative care.   [name], MD  07/14/2020, 4:05 PM

## 2020-07-14 NOTE — Patient Instructions (Signed)
Please notify Dr Denman George at phone number [phone] if you notice vaginal bleeding, new pelvic or abdominal pains, bloating, feeling full easy, or a change in bladder or bowel function.   Please contact Dr Serita Grit office (at [phone]) in November to request an appointment with her for February, 2022.

## 2020-09-26 ENCOUNTER — Telehealth: Payer: Self-pay

## 2020-09-26 NOTE — Telephone Encounter (Signed)
LM for Molly Weeks stating that the schedule for 2022 is not compledted at this time.  Requested that she call the office in December 2021 to set up her appointment with Dr. Denman George in February 2022.

## 2020-10-26 DIAGNOSIS — R5381 Other malaise: Secondary | ICD-10-CM | POA: Diagnosis not present

## 2020-10-26 DIAGNOSIS — R000 Tachycardia, unspecified: Secondary | ICD-10-CM | POA: Diagnosis not present

## 2020-12-22 ENCOUNTER — Encounter: Payer: Self-pay | Admitting: Gynecologic Oncology

## 2020-12-25 ENCOUNTER — Other Ambulatory Visit: Payer: Self-pay

## 2020-12-25 ENCOUNTER — Encounter: Payer: Self-pay | Admitting: Gynecologic Oncology

## 2020-12-25 ENCOUNTER — Inpatient Hospital Stay: Payer: BC Managed Care – PPO | Attending: Gynecologic Oncology | Admitting: Gynecologic Oncology

## 2020-12-25 VITALS — BP 134/64 | HR 72 | Temp 97.2°F | Resp 20 | Ht 64.0 in | Wt 206.0 lb

## 2020-12-25 DIAGNOSIS — Z90722 Acquired absence of ovaries, bilateral: Secondary | ICD-10-CM | POA: Insufficient documentation

## 2020-12-25 DIAGNOSIS — Z90710 Acquired absence of both cervix and uterus: Secondary | ICD-10-CM | POA: Insufficient documentation

## 2020-12-25 DIAGNOSIS — E119 Type 2 diabetes mellitus without complications: Secondary | ICD-10-CM | POA: Insufficient documentation

## 2020-12-25 DIAGNOSIS — C541 Malignant neoplasm of endometrium: Secondary | ICD-10-CM | POA: Diagnosis not present

## 2020-12-25 DIAGNOSIS — E559 Vitamin D deficiency, unspecified: Secondary | ICD-10-CM | POA: Insufficient documentation

## 2020-12-25 NOTE — Patient Instructions (Signed)
Please notify Dr Dardan Shelton at phone number [phone] if you notice vaginal bleeding, new pelvic or abdominal pains, bloating, feeling full easy, or a change in bladder or bowel function.   Please contact Dr Iolanda Folson's office (at [phone]) in November, 2022 to request an appointment with her for February, 2023.  

## 2020-12-25 NOTE — Progress Notes (Signed)
Gynecologic Oncology Follow-up Visit  Chief Complaint:  Chief Complaint  Patient presents with  . Endometrial cancer [facility])    Assessment/Plan:  [name]  is a 65 y.o.  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 S/p robotic staging procedure on 06/22/20.  Pathology revealed low risk factors for recurrence, therefore no adjuvant therapy is recommended according to NCCN guidelines.  I discussed risk for recurrence and typical symptoms encouraged her to notify us of these should they develop between visits.  I recommend she have follow-up every 6 months for 5 years in accordance with NCCN guidelines. Those visits should include symptom assessment, physical exam and pelvic examination. Pap smears are not indicated or recommended in the routine surveillance of endometrial cancer.  HPI: [name] is a 65 year old P1 who was seen in consultation at the request of Dr Glo Herring and Derrek Monaco NP for evaluation of grade 2 endometrioid endometrial cancer.  The patient reported a history of postmenopausal bleeding that occurred in July, 3 days before her scheduled annual gynecologic visit with Derrek Monaco, the nurse practitioner who works with Dr. Glo Herring.  She reported her symptoms to [facility] at that time who performed a Pap smear and recommended an ultrasound.  An endocervical polyp was seen.  The ultrasound was performed on 06/04/2020 and revealed a uterus measuring 6.8 x 3.9 x 5.4 cm with a 16.7 mm thickened endometrium.  The right ovary was not visualized.  The left ovary measured 2 x 2.2 x 1.6 cm.  The following day a biopsy of the cervix (removal of the cervical polyp) and endometrial biopsy were performed on 06/05/2020.  Final pathology from this revealed a benign endocervical type polyp with no malignancy.  Additionally the endometrial biopsy confirmed FIGO grade 2 endometrioid adenocarcinoma.  Her family history  is remarkable for a brother with leukemia and multiple myeloma.  Interval Hx:   On 06/22/20 she underwent robotic assisted total hysterectomy, BSO, sentinel lymph node biopsy. Intraoperative findings were significant for a 7 cm normal-appearing uterus, normal tubes and ovaries, no suspicious nodes. Surgery was uncomplicated.  Final pathology revealed a FIGO grade 2 endometrioid adenocarcinoma of the endometrium with less than 1 mm invasion of a total fifth myometrial thickness of 22 mm, representing inner half myometrial invasion, no LVSI, negative sentinel lymph nodes, cervix, and adnexa. MMR and MSI testing pending. She was determined to have low risk features and no adjuvant therapy was recommended in accordance with NCCN guidelines.  Since surgery she has done well with no complaints. She has some mild urinary incontinence/difficulty holding urine. Declines referral.    Current Meds:  Outpatient Encounter Medications as of 12/25/2020  Medication Sig  . Cholecalciferol (VITAMIN D3) 50 MCG (2000 UT) TABS Take 2,000 Units by mouth daily.  . [DISCONTINUED] senna-docusate (SENOKOT-S) 8.6-50 MG tablet Take 2 tablets by mouth at bedtime. For AFTER surgery, do not take if having diarrhea   No facility-administered encounter medications on file as of 12/25/2020.    Allergy:  Allergies  Allergen Reactions  . Cephalexin Diarrhea and Nausea And Vomiting  . Moxifloxacin Rash    fever  . Tamiflu [Oseltamivir Phosphate]     Severe nausea right after taking in 2018    Social Hx:   Social History   Socioeconomic History  . Marital status: Married    Spouse name: Not on file  . Number of children: Not on file  . Years of education: Not  on file  . Highest education level: Not on file  Occupational History  . Occupation: Optometrist  Tobacco Use  . Smoking status: Never Smoker  . Smokeless tobacco: Never Used  Vaping Use  . Vaping Use: Never used  Substance and Sexual Activity  . Alcohol use:  No  . Drug use: No  . Sexual activity: Not Currently    Birth control/protection: Surgical, Post-menopausal    Comment: tubal  Other Topics Concern  . Not on file  Social History Narrative  . Not on file   Social Determinants of Health   Financial Resource Strain: Low Risk   . Difficulty of Paying Living Expenses: Not hard at all  Food Insecurity: No Food Insecurity  . Worried About Charity fundraiser in the Last Year: Never true  . Ran Out of Food in the Last Year: Never true  Transportation Needs: No Transportation Needs  . Lack of Transportation (Medical): No  . Lack of Transportation (Non-Medical): No  Physical Activity: Insufficiently Active  . Days of Exercise per Week: 3 days  . Minutes of Exercise per Session: 30 min  Stress: No Stress Concern Present  . Feeling of Stress : Not at all  Social Connections: Socially Integrated  . Frequency of Communication with Friends and Family: Twice a week  . Frequency of Social Gatherings with Friends and Family: Once a week  . Attends Religious Services: More than 4 times per year  . Active Member of Clubs or Organizations: Yes  . Attends Archivist Meetings: More than 4 times per year  . Marital Status: Married  Human resources officer Violence: Not At Risk  . Fear of Current or Ex-Partner: No  . Emotionally Abused: No  . Physically Abused: No  . Sexually Abused: No    Past Surgical Hx:  Past Surgical History:  Procedure Laterality Date  . ROBOTIC ASSISTED TOTAL HYSTERECTOMY WITH BILATERAL SALPINGO OOPHERECTOMY Bilateral 06/22/2020   Procedure: XI ROBOTIC ASSISTED TOTAL HYSTERECTOMY WITH BILATERAL SALPINGO OOPHORECTOMY;  Surgeon: Everitt Amber, MD;  Location: [facility];  Service: Gynecology;  Laterality: Bilateral;  . SENTINEL NODE BIOPSY N/A 06/22/2020   Procedure: SENTINEL NODE BIOPSY;  Surgeon: Everitt Amber, MD;  Location: [facility];  Service: Gynecology;  Laterality: N/A;  . TUBAL LIGATION  1990    Past Medical Hx:  Past  Medical History:  Diagnosis Date  . Allergic rhinitis   . Cancer ([place])   . Diabetes mellitus without complication ([place])   . Vitamin D deficiency 04/08/2017    Past Gynecological History:  See HPI, P1 (SVD). No LMP recorded. Patient is postmenopausal.  Family Hx:  Family History  Problem Relation Age of Onset  . Asthma Other        multiple relatives  . Allergies Other        multiple relatives  . Diabetes Paternal Grandfather   . Hypertension Paternal Grandfather   . Heart disease Paternal Grandfather   . Diabetes Paternal Grandmother   . Hypertension Paternal Grandmother   . Heart disease Paternal Grandmother   . Diabetes Maternal Grandmother   . Hypertension Maternal Grandmother   . Heart disease Maternal Grandmother   . Diabetes Maternal Grandfather   . Hypertension Maternal Grandfather   . Heart disease Maternal Grandfather   . Diabetes Father   . Hypertension Father   . Heart disease Father   . Hypertension Mother   . Diabetes Mother   . Heart disease Mother   . Cancer Brother  leukemia    Review of Systems:  Constitutional  Feels well,    ENT Normal appearing ears and nares bilaterally Skin/Breast  No rash, sores, jaundice, itching, dryness Cardiovascular  No chest pain, shortness of breath, or edema  Pulmonary  No cough or wheeze.  Gastro Intestinal  No nausea, vomitting, or diarrhoea. No bright red blood per rectum, no abdominal pain, change in bowel movement, or constipation.  Genito Urinary  No frequency, urgency, dysuria, no bleeding Musculo Skeletal  No myalgia, arthralgia, joint swelling or pain  Neurologic  No weakness, numbness, change in gait,  Psychology  No depression, anxiety, insomnia.   Vitals:  Blood pressure 134/64, pulse 72, temperature (!) 97.2 F (36.2 C), temperature source Tympanic, resp. rate 20, height _0  (1.626 m), weight 206 lb (93.4 kg), SpO2 99 %.  Physical Exam: WD in NAD Neck  Supple NROM, without any  enlargements.  Lymph Node Survey No cervical supraclavicular or inguinal adenopathy Cardiovascular  Well perfused peripheries Lungs  No apparent increased WOB Skin  No rash/lesions/breakdown  Psychiatry  Alert and oriented to person, place, and time  Abdomen  Normoactive bowel sounds, abdomen soft, non-tender and obese without evidence of hernia. Well healed incisions. Genito Urinary  Vulva/vagina: Normal external female genitalia.  No lesions. No discharge or bleeding.  Vagina: smooth, no lesions. No palpable masses Rectal  deferred Extremities  No bilateral cyanosis, clubbing or edema.  [name], MD  12/25/2020, 2:17 PM

## 2020-12-26 DIAGNOSIS — C55 Malignant neoplasm of uterus, part unspecified: Secondary | ICD-10-CM | POA: Diagnosis not present

## 2020-12-26 DIAGNOSIS — Z0189 Encounter for other specified special examinations: Secondary | ICD-10-CM | POA: Diagnosis not present

## 2020-12-26 DIAGNOSIS — R7303 Prediabetes: Secondary | ICD-10-CM | POA: Diagnosis not present

## 2021-01-23 DIAGNOSIS — Z0189 Encounter for other specified special examinations: Secondary | ICD-10-CM | POA: Diagnosis not present

## 2021-01-23 DIAGNOSIS — R7303 Prediabetes: Secondary | ICD-10-CM | POA: Diagnosis not present

## 2021-01-23 DIAGNOSIS — C55 Malignant neoplasm of uterus, part unspecified: Secondary | ICD-10-CM | POA: Diagnosis not present

## 2021-01-29 DIAGNOSIS — Z0001 Encounter for general adult medical examination with abnormal findings: Secondary | ICD-10-CM | POA: Diagnosis not present

## 2021-01-31 ENCOUNTER — Other Ambulatory Visit (HOSPITAL_COMMUNITY): Payer: Self-pay | Admitting: Internal Medicine

## 2021-01-31 DIAGNOSIS — Z13820 Encounter for screening for osteoporosis: Secondary | ICD-10-CM

## 2021-02-12 ENCOUNTER — Other Ambulatory Visit: Payer: Self-pay

## 2021-02-12 ENCOUNTER — Emergency Department (INDEPENDENT_AMBULATORY_CARE_PROVIDER_SITE_OTHER)
Admission: RE | Admit: 2021-02-12 | Discharge: 2021-02-12 | Disposition: A | Discharge status: Home / Self Care | Payer: BC Managed Care – PPO | Source: Ambulatory Visit

## 2021-02-12 VITALS — BP 125/79 | HR 83 | Temp 98.7°F | Resp 18 | Ht 64.0 in | Wt 204.0 lb

## 2021-02-12 DIAGNOSIS — R0602 Shortness of breath: Secondary | ICD-10-CM

## 2021-02-12 DIAGNOSIS — R0789 Other chest pain: Secondary | ICD-10-CM | POA: Diagnosis not present

## 2021-02-12 DIAGNOSIS — J069 Acute upper respiratory infection, unspecified: Secondary | ICD-10-CM | POA: Diagnosis not present

## 2021-02-12 DIAGNOSIS — R059 Cough, unspecified: Secondary | ICD-10-CM

## 2021-02-12 MED ORDER — PREDNISONE 10 MG PO TABS
20.0000 mg | ORAL_TABLET | Freq: Every day | ORAL | 0 refills | Status: DC
Start: 2021-02-12 — End: 2021-06-25

## 2021-02-12 MED ORDER — CLARITHROMYCIN 500 MG PO TABS
500.0000 mg | ORAL_TABLET | Freq: Two times a day (BID) | ORAL | 0 refills | Status: AC
Start: 2021-02-12 — End: 2021-02-19

## 2021-02-12 NOTE — ED Triage Notes (Signed)
Chest congestion, runny nose, cough started yesterday  Vaccinated

## 2021-02-12 NOTE — Discharge Instructions (Addendum)
I have sent in a prednisone taper for you to take for 6 days. 6 tablets on day one, 5 tablets on day two, 4 tablets on day three, 3 tablets on day four, 2 tablets on day five, and 1 tablet on day six.  I have also given you a printed antibiotic prescription for you to get filled if viral testing is negative and your symptoms are not improving by Thursday.  Your COVID and flu test is pending.  You should self quarantine until the test result is back.    Take Tylenol or ibuprofen as needed for fever or discomfort.  Rest and keep yourself hydrated.    Follow-up with your primary care provider if your symptoms are not improving.

## 2021-02-12 NOTE — ED Provider Notes (Signed)
[place]   [id number] 02/12/21 Arrival Time: 6270   CC: COVID symptoms  SUBJECTIVE: History from: patient.  Molly Weeks is a 65 y.o. female who presents with nasal congestion, cough, rhinorrhea, laryngitis, chest tightness and purulent nasal discharge since yesterday. Reports sinus pain since yesterday as well. Reports that she has allergies as well and has been having symptoms for the last week. Denies sick exposure to COVID, flu or strep. Denies recent travel. Has negative history of Covid. Has completed Covid vaccines and booster. Has completed flu vaccine. Has not taken OTC medications for this. Cough is worse with activity. Reports that the last time that she felt this way, she had pneumonia and that it took multiple rounds of antibiotics to treat successfully. Denies fever, sore throat, SOB, wheezing, nausea, changes in bowel or bladder habits.    ROS: As per HPI.  All other pertinent ROS negative.     Past Medical History:  Diagnosis Date  . Allergic rhinitis   . Cancer ([place])   . Diabetes mellitus without complication ([place])   . Vitamin D deficiency 04/08/2017   Past Surgical History:  Procedure Laterality Date  . ROBOTIC ASSISTED TOTAL HYSTERECTOMY WITH BILATERAL SALPINGO OOPHERECTOMY Bilateral 06/22/2020   Procedure: XI ROBOTIC ASSISTED TOTAL HYSTERECTOMY WITH BILATERAL SALPINGO OOPHORECTOMY;  Surgeon: Everitt Amber, MD;  Location: [facility];  Service: Gynecology;  Laterality: Bilateral;  . SENTINEL NODE BIOPSY N/A 06/22/2020   Procedure: SENTINEL NODE BIOPSY;  Surgeon: Everitt Amber, MD;  Location: [facility];  Service: Gynecology;  Laterality: N/A;  . TUBAL LIGATION  1990   Allergies  Allergen Reactions  . Cephalexin Diarrhea and Nausea And Vomiting  . Moxifloxacin Rash    fever  . Tamiflu [Oseltamivir Phosphate]     Severe nausea right after taking in 2018   No current facility-administered medications on file prior to encounter.   Current Outpatient Medications  on File Prior to Encounter  Medication Sig Dispense Refill  . Cholecalciferol (VITAMIN D3) 50 MCG (2000 UT) TABS Take 2,000 Units by mouth daily.     Social History   Socioeconomic History  . Marital status: Married    Spouse name: Not on file  . Number of children: Not on file  . Years of education: Not on file  . Highest education level: Not on file  Occupational History  . Occupation: Optometrist  Tobacco Use  . Smoking status: Never Smoker  . Smokeless tobacco: Never Used  Vaping Use  . Vaping Use: Never used  Substance and Sexual Activity  . Alcohol use: No  . Drug use: No  . Sexual activity: Not Currently    Birth control/protection: Surgical, Post-menopausal    Comment: tubal  Other Topics Concern  . Not on file  Social History Narrative  . Not on file   Social Determinants of Health   Financial Resource Strain: Low Risk   . Difficulty of Paying Living Expenses: Not hard at all  Food Insecurity: No Food Insecurity  . Worried About Charity fundraiser in the Last Year: Never true  . Ran Out of Food in the Last Year: Never true  Transportation Needs: No Transportation Needs  . Lack of Transportation (Medical): No  . Lack of Transportation (Non-Medical): No  Physical Activity: Insufficiently Active  . Days of Exercise per Week: 3 days  . Minutes of Exercise per Session: 30 min  Stress: No Stress Concern Present  . Feeling of Stress : Not at all  Social Connections:  Socially Integrated  . Frequency of Communication with Friends and Family: Twice a week  . Frequency of Social Gatherings with Friends and Family: Once a week  . Attends Religious Services: More than 4 times per year  . Active Member of Clubs or Organizations: Yes  . Attends Archivist Meetings: More than 4 times per year  . Marital Status: Married  Human resources officer Violence: Not At Risk  . Fear of Current or Ex-Partner: No  . Emotionally Abused: No  . Physically Abused: No  . Sexually  Abused: No   Family History  Problem Relation Age of Onset  . Asthma Other        multiple relatives  . Allergies Other        multiple relatives  . Diabetes Paternal Grandfather   . Hypertension Paternal Grandfather   . Heart disease Paternal Grandfather   . Diabetes Paternal Grandmother   . Hypertension Paternal Grandmother   . Heart disease Paternal Grandmother   . Diabetes Maternal Grandmother   . Hypertension Maternal Grandmother   . Heart disease Maternal Grandmother   . Diabetes Maternal Grandfather   . Hypertension Maternal Grandfather   . Heart disease Maternal Grandfather   . Diabetes Father   . Hypertension Father   . Heart disease Father   . Hypertension Mother   . Diabetes Mother   . Heart disease Mother   . Cancer Brother        leukemia    OBJECTIVE:  Vitals:   02/12/21 1205  BP: 125/79  Pulse: 83  Resp: 18  Temp: 98.7 F (37.1 C)  TempSrc: Oral  SpO2: 97%  Weight: 204 lb (92.5 kg)  Height: 5\' 4"  (1.626 m)     General appearance: alert; appears fatigued, but nontoxic; speaking in full sentences and tolerating own secretions HEENT: NCAT; Ears: EACs clear, TMs pearly gray; Eyes: PERRL.  EOM grossly intact. Sinuses: maxillary sinuses mildly tender; Nose: nares patent with clear rhinorrhea, Throat: oropharynx erythematous, cobblestoning present, tonsils non erythematous or enlarged, uvula midline  Neck: supple without LAD Lungs: unlabored respirations, symmetrical air entry; cough: mild; no respiratory distress; mild wheezing to bilateral lower lobes Heart: regular rate and rhythm.  Radial pulses 2+ symmetrical bilaterally Skin: warm and dry Psychological: alert and cooperative; normal mood and affect  LABS:  No results found for this or any previous visit (from the past 24 hour(s)).   ASSESSMENT & PLAN:  1. Upper respiratory tract infection, unspecified type   2. Cough   3. SOB (shortness of breath)   4. Chest tightness     Meds ordered  this encounter  Medications  . predniSONE (DELTASONE) 10 MG tablet    Sig: Take 2 tablets (20 mg total) by mouth daily.    Dispense:  15 tablet    Refill:  0    Order Specific Question:   Supervising Provider    Answer:   Chase Picket [id number]  . clarithromycin (BIAXIN) 500 MG tablet    Sig: Take 1 tablet (500 mg total) by mouth 2 (two) times daily for 7 days.    Dispense:  14 tablet    Refill:  0    Do not fill before 02/15/21    Order Specific Question:   Supervising Provider    Answer:   Chase Picket [[id number]]    Prescribed steroids Gave paper prescription for Biaxin if respiratory viral tests are negative and there is not symptom resolution or improvement by 02/15/21 Continue  supportive care at home COVID and flu testing ordered.  It will take between 2-3 days for test results. Someone will contact you regarding abnormal results.   Work note provided Patient should remain in quarantine until they have received Covid results.  If negative you may resume normal activities (go back to work/school) while practicing hand hygiene, social distance, and mask wearing.  If positive, patient should remain in quarantine for at least 5 days from symptom onset AND greater than 72 hours after symptoms resolution (absence of fever without the use of fever-reducing medication and improvement in respiratory symptoms), whichever is longer Get plenty of rest and push fluids Use OTC zyrtec for nasal congestion, runny nose, and/or sore throat Use OTC flonase for nasal congestion and runny nose Use medications daily for symptom relief Use OTC medications like ibuprofen or tylenol as needed fever or pain Call or go to the ED if you have any new or worsening symptoms such as fever, worsening cough, shortness of breath, chest tightness, chest pain, turning blue, changes in mental status.  Reviewed expectations re: course of current medical issues. Questions answered. Outlined signs and symptoms  indicating need for more acute intervention. Patient verbalized understanding. After Visit Summary given.         Faustino Congress, NP 02/12/21 1549

## 2021-02-13 ENCOUNTER — Other Ambulatory Visit (HOSPITAL_COMMUNITY): Payer: BC Managed Care – PPO

## 2021-02-13 ENCOUNTER — Encounter (HOSPITAL_COMMUNITY): Payer: Self-pay

## 2021-02-14 ENCOUNTER — Encounter: Payer: Self-pay | Admitting: Adult Health

## 2021-02-14 LAB — COVID-19, FLU A+B NAA
Influenza A, NAA: NOT DETECTED
Influenza B, NAA: NOT DETECTED
SARS-CoV-2, NAA: NOT DETECTED

## 2021-03-08 ENCOUNTER — Other Ambulatory Visit: Payer: Self-pay

## 2021-03-08 ENCOUNTER — Ambulatory Visit (HOSPITAL_COMMUNITY)
Admission: RE | Admit: 2021-03-08 | Discharge: 2021-03-08 | Disposition: A | Discharge status: Home / Self Care | Payer: BC Managed Care – PPO | Source: Ambulatory Visit | Attending: Internal Medicine | Admitting: Internal Medicine

## 2021-03-08 DIAGNOSIS — M85851 Other specified disorders of bone density and structure, right thigh: Secondary | ICD-10-CM | POA: Diagnosis not present

## 2021-03-08 DIAGNOSIS — Z13820 Encounter for screening for osteoporosis: Secondary | ICD-10-CM | POA: Insufficient documentation

## 2021-03-08 DIAGNOSIS — Z780 Asymptomatic menopausal state: Secondary | ICD-10-CM | POA: Diagnosis not present

## 2021-03-12 ENCOUNTER — Other Ambulatory Visit (HOSPITAL_COMMUNITY): Payer: Self-pay | Admitting: Internal Medicine

## 2021-03-12 DIAGNOSIS — Z1231 Encounter for screening mammogram for malignant neoplasm of breast: Secondary | ICD-10-CM

## 2021-04-18 ENCOUNTER — Ambulatory Visit (HOSPITAL_COMMUNITY)
Admission: RE | Admit: 2021-04-18 | Discharge: 2021-04-18 | Disposition: A | Discharge status: Home / Self Care | Payer: BC Managed Care – PPO | Source: Ambulatory Visit | Attending: Internal Medicine | Admitting: Internal Medicine

## 2021-04-18 DIAGNOSIS — Z1231 Encounter for screening mammogram for malignant neoplasm of breast: Secondary | ICD-10-CM | POA: Diagnosis not present

## 2021-04-30 ENCOUNTER — Telehealth: Payer: Self-pay

## 2021-06-22 ENCOUNTER — Other Ambulatory Visit: Payer: BC Managed Care – PPO | Admitting: Adult Health

## 2021-06-25 ENCOUNTER — Other Ambulatory Visit: Payer: Self-pay

## 2021-06-25 ENCOUNTER — Encounter: Payer: Self-pay | Admitting: Adult Health

## 2021-06-25 ENCOUNTER — Ambulatory Visit (INDEPENDENT_AMBULATORY_CARE_PROVIDER_SITE_OTHER): Payer: BC Managed Care – PPO | Admitting: Adult Health

## 2021-06-25 VITALS — BP 144/90 | HR 62 | Ht 64.25 in | Wt 210.0 lb

## 2021-06-25 DIAGNOSIS — Z01419 Encounter for gynecological examination (general) (routine) without abnormal findings: Secondary | ICD-10-CM

## 2021-06-25 DIAGNOSIS — Z1211 Encounter for screening for malignant neoplasm of colon: Secondary | ICD-10-CM | POA: Diagnosis not present

## 2021-06-25 DIAGNOSIS — C541 Malignant neoplasm of endometrium: Secondary | ICD-10-CM

## 2021-06-25 LAB — HEMOCCULT GUIAC POC 1CARD (OFFICE): Fecal Occult Blood, POC: NEGATIVE

## 2021-06-25 NOTE — Progress Notes (Signed)
Patient ID: Molly Weeks, female   DOB: [birth year], 65 y.o.   MRN: [id number] History of Present Illness: Molly Weeks is a 65 year old white female,married, sp robotic laparoscopic hysterectomy, 06/22/20 for stage 1 grade, endometrioid adenocarcinoma of the uterus.She saw Dr Denman George 6 months ago. She will get an exam every 6 months for 5 years, and this is her well woman physical . She has retired and husband is having health issues.  PCP is Dr Nevada Crane.   Current Medications, Allergies, Past Medical History, Past Surgical History, Family History and Social History were reviewed in [employer] record.     Review of Systems:  Patient denies any headaches, hearing loss, fatigue, blurred vision, shortness of breath, chest pain, abdominal pain, problems with bowel movements, urination, or intercourse. (Not active).No joint pain or mood swings.  Denies any vaginal spotting.  Physical Exam:BP (!) 144/90 (BP Location: Right Arm, Patient Position: Sitting, Cuff Size: Large)   Pulse 62   Ht 5' 4.25" (1.632 m)   Wt 210 lb (95.3 kg)   BMI 35.77 kg/m   General:  Well developed, well nourished, no acute distress Skin:  Warm and dry Neck:  Midline trachea, normal thyroid, good ROM, no lymphadenopathy,no carotid bruits heard. Lungs; Clear to auscultation bilaterally Breast:  No dominant palpable mass, retraction, or nipple discharge Cardiovascular: Regular rate and rhythm Abdomen:  Soft, non tender, no hepatosplenomegaly Pelvic:  External genitalia is normal in appearance, no lesions.  The vagina is normal in appearance, no vaginal lesions and vaginal cuff looks good. Urethra has no lesions or masses. The cervix is and uterus are absent.  No adnexal masses or tenderness noted.Bladder is non tender, no masses felt. Rectal: Good sphincter tone, no polyps, or hemorrhoids felt.  Hemoccult negative. Extremities/musculoskeletal:  No swelling or varicosities noted, no clubbing or cyanosis Psych:   No mood changes, alert and cooperative,seems happy AA is 0 Fall risk is moderate Depression screen [facility] 2/9 06/25/2021 05/11/2020 04/03/2018  Decreased Interest 0 0 0  Down, Depressed, Hopeless 0 0 0  PHQ - 2 Score 0 0 0  Altered sleeping 0 0 -  Tired, decreased energy 0 0 -  Change in appetite 0 0 -  Feeling bad or failure about yourself  0 0 -  Trouble concentrating 0 0 -  Moving slowly or fidgety/restless 0 0 -  Suicidal thoughts 0 0 -  PHQ-9 Score 0 0 -    GAD 7 : Generalized Anxiety Score 06/25/2021 05/11/2020  Nervous, Anxious, on Edge 0 0  Control/stop worrying 0 0  Worry too much - different things 0 0  Trouble relaxing 0 0  Restless 0 0  Easily annoyed or irritable 0 0  Afraid - awful might happen 0 0  Total GAD 7 Score 0 0    Upstream - 06/25/21 0848       Pregnancy Intention Screening   Does the patient want to become pregnant in the next year? N/A    Does the patient's partner want to become pregnant in the next year? N/A    Would the patient like to discuss contraceptive options today? N/A      Contraception Wrap Up   Current Method Female Sterilization   hyst   End Method Female Sterilization   hyst   Contraception Counseling Provided No              Examination chaperoned by Levy Pupa LPN   Impression and Plan: 1. Well woman exam  with routine gynecological exam Physical in 1 year Mammogram yearly Colonoscopy advised Labs with PCP  2. Endometrial cancer ([place]) See Dr Denman George in 6 months  3. Encounter for screening fecal occult blood testing

## 2021-08-28 DIAGNOSIS — E782 Mixed hyperlipidemia: Secondary | ICD-10-CM | POA: Diagnosis not present

## 2021-08-28 DIAGNOSIS — E559 Vitamin D deficiency, unspecified: Secondary | ICD-10-CM | POA: Diagnosis not present

## 2021-08-28 DIAGNOSIS — R7303 Prediabetes: Secondary | ICD-10-CM | POA: Diagnosis not present

## 2021-09-04 DIAGNOSIS — E782 Mixed hyperlipidemia: Secondary | ICD-10-CM | POA: Diagnosis not present

## 2021-09-04 DIAGNOSIS — R7303 Prediabetes: Secondary | ICD-10-CM | POA: Diagnosis not present

## 2021-09-04 DIAGNOSIS — C55 Malignant neoplasm of uterus, part unspecified: Secondary | ICD-10-CM | POA: Diagnosis not present

## 2021-09-04 DIAGNOSIS — E559 Vitamin D deficiency, unspecified: Secondary | ICD-10-CM | POA: Diagnosis not present

## 2021-11-02 ENCOUNTER — Telehealth: Payer: Self-pay | Admitting: *Deleted

## 2021-11-02 NOTE — Telephone Encounter (Signed)
Attempted to return the patient's call and left a message to call the office back to scheduled a follow up appt

## 2021-11-05 ENCOUNTER — Telehealth: Payer: Self-pay

## 2021-11-05 NOTE — Telephone Encounter (Signed)
Spoke with Molly Weeks this afternoon regarding a follow up appt. Appt scheduled for 12/27/2021 at 9:00 am. Pt agreeable to date and time of follow up appt with Dr. Delsa Sale.

## 2021-12-18 ENCOUNTER — Encounter: Payer: Self-pay | Admitting: Obstetrics & Gynecology

## 2021-12-26 NOTE — Progress Notes (Signed)
Follow Up Note: Gyn-Onc  Molly Weeks 66 y.o. female  CC: Se presents for a f/u visit   HPI: The oncology history was reviewed.  Interval History: She denies any vaginal bleeding, abdominal/pelvic pain, cough, lethargy or increasing abdominal girth.   Review of Systems  Review of Systems  Constitutional:  Negative for malaise/fatigue and weight loss.  Respiratory:  Negative for shortness of breath and wheezing.   Cardiovascular:  Negative for chest pain and leg swelling.  Gastrointestinal:  Negative for abdominal pain, blood in stool, constipation, nausea and vomiting.  Genitourinary:  Negative for dysuria, frequency, hematuria and urgency.  Musculoskeletal:  Negative for joint pain and myalgias.  Neurological:  Negative for weakness.  Psychiatric/Behavioral:  Negative for depression. The patient does not have insomnia.    Current medications, allergy, social history, past surgical history, past medical history, family history were all reviewed.    Vitals:  BP 135/66 (BP Location: Right Arm, Patient Position: Sitting)    Pulse 70    Temp 98.2 F (36.8 C) (Oral)    Resp 18    Ht 5' 5.35" (1.66 m)    Wt 209 lb 14.4 oz (95.2 kg)    SpO2 98%    BMI 34.55 kg/m   Physical Exam:  Physical Exam Exam conducted with a chaperone present.  Constitutional:      General: She is not in acute distress. Cardiovascular:     Rate and Rhythm: Normal rate and regular rhythm.  Pulmonary:     Effort: Pulmonary effort is normal.     Breath sounds: Normal breath sounds. No wheezing or rhonchi.  Abdominal:     Palpations: Abdomen is soft.     Tenderness: There is no abdominal tenderness. There is no right CVA tenderness or left CVA tenderness.     Hernia: No hernia is present.  Genitourinary:    General: Normal vulva.     Urethra: No urethral lesion.     Vagina: No lesions. No bleeding Musculoskeletal:     Cervical back: Neck supple.     Right lower leg: No edema.     Left lower leg: No  edema.  Lymphadenopathy:     Upper Body:     Right upper body: No supraclavicular adenopathy.     Left upper body: No supraclavicular adenopathy.     Lower Body: No right inguinal adenopathy. No left inguinal adenopathy.  Skin:    Findings: No rash.  Neurological:     Mental Status: She is oriented to person, place, and time.   Assessment/Plan:  Endometrial cancer ([place]) 65 .o.  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  Negative symptom review, normal exam.  No evidence of recurrence  >recommend she have follow-up every 6 months for 5 years in accordance with NCCN guidelines.  Alternate visits /generalist gynecologist      Lahoma Crocker, MD

## 2021-12-26 NOTE — Assessment & Plan Note (Addendum)
57 .o.  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  Negative symptom review, normal exam.  No evidence of recurrence  >recommend she have follow-up every 6 months for 5 years in accordance with NCCN guidelines.  Alternate visits /generalist gynecologist

## 2021-12-27 ENCOUNTER — Encounter: Payer: Self-pay | Admitting: Obstetrics & Gynecology

## 2021-12-27 ENCOUNTER — Other Ambulatory Visit: Payer: Self-pay

## 2021-12-27 ENCOUNTER — Inpatient Hospital Stay: Payer: BC Managed Care – PPO | Attending: Obstetrics & Gynecology | Admitting: Obstetrics & Gynecology

## 2021-12-27 VITALS — BP 135/66 | HR 70 | Temp 98.2°F | Resp 18 | Ht 65.35 in | Wt 209.9 lb

## 2021-12-27 DIAGNOSIS — Z8542 Personal history of malignant neoplasm of other parts of uterus: Secondary | ICD-10-CM | POA: Insufficient documentation

## 2021-12-27 DIAGNOSIS — C541 Malignant neoplasm of endometrium: Secondary | ICD-10-CM

## 2021-12-27 NOTE — Patient Instructions (Signed)
Return in 1 year ?

## 2022-01-18 ENCOUNTER — Emergency Department (INDEPENDENT_AMBULATORY_CARE_PROVIDER_SITE_OTHER)
Admission: EM | Admit: 2022-01-18 | Discharge: 2022-01-18 | Disposition: A | Discharge status: Home / Self Care | Payer: BC Managed Care – PPO | Source: Home / Self Care | Attending: Family Medicine | Admitting: Family Medicine

## 2022-01-18 ENCOUNTER — Other Ambulatory Visit: Payer: Self-pay

## 2022-01-18 ENCOUNTER — Telehealth: Payer: Self-pay

## 2022-01-18 DIAGNOSIS — J302 Other seasonal allergic rhinitis: Secondary | ICD-10-CM | POA: Diagnosis not present

## 2022-01-18 DIAGNOSIS — J069 Acute upper respiratory infection, unspecified: Secondary | ICD-10-CM | POA: Diagnosis not present

## 2022-01-18 LAB — POC SARS CORONAVIRUS 2 AG -  ED: SARS Coronavirus 2 Ag: NEGATIVE

## 2022-01-18 MED ORDER — AZITHROMYCIN 250 MG PO TABS: ORAL_TABLET | ORAL | 0 refills | Status: DC

## 2022-01-18 MED ORDER — PREDNISONE 20 MG PO TABS: ORAL_TABLET | ORAL | 0 refills | Status: DC

## 2022-01-18 NOTE — Discharge Instructions (Signed)
Take plain guaifenesin (1200mg  extended release tabs such as Mucinex) twice daily, with plenty of water, for cough and congestion.  May add Pseudoephedrine (30mg , one or two every 4 to 6 hours) for sinus congestion.  Get adequate rest.   ?May use Afrin nasal spray (or generic oxymetazoline) each morning for about 5 days and then discontinue.  Also recommend using saline nasal spray several times daily and saline nasal irrigation (AYR is a common brand).  Use Flonase nasal spray each morning after using Afrin nasal spray and saline nasal irrigation. ?Try warm salt water gargles for sore throat.  ?Stop all antihistamines (Claritin, etc) for now, and other non-prescription cough/cold preparations. ?May take Tylenol as needed. ?May take Delsym Cough Suppressant ("12 Hour Cough Relief") at bedtime for nighttime cough.  ?

## 2022-01-18 NOTE — Telephone Encounter (Signed)
Pts husband called asking about meds sent to pharmacy. Pharmacy told him they didn't have scripts. Called pharmacy, advised they were ready for pick up. Pt notified.  ?

## 2022-01-18 NOTE — ED Provider Notes (Signed)
[facility]    CSN: [id number] Arrival date & time: 01/18/22  0948      History   Chief Complaint Chief Complaint  Patient presents with   Cough   Nasal Congestion    HPI Molly Weeks is a 66 y.o. female.   Patient has a history of seasonal rhinitis with increased sinus congestion during the past several weeks.  Last night she developed a non-productive cough and increased nasal congestion.  She denies fever, pleuritic pain, and shortness of breath. She has a history of endometrial CA, in remission.  She has a history of recurring bronchitis  The history is provided by the patient and the spouse.   Past Medical History:  Diagnosis Date   Allergic rhinitis    Cancer ([place])    Diabetes mellitus without complication ([place])    Vitamin D deficiency 04/08/2017    Patient Active Problem List   Diagnosis Date Noted   Encounter for screening fecal occult blood testing 06/25/2021   Well woman exam with routine gynecological exam 06/25/2021   Endometrial cancer ([place]) 06/12/2020   Obesity (BMI 30-39.9) 06/12/2020   Elevated hemoglobin A1c 05/11/2020   PMB (postmenopausal bleeding) 05/11/2020   Encounter for gynecological examination with Papanicolaou smear of cervix 05/11/2020   Screening for colorectal cancer 04/03/2018   Encounter for well woman exam with routine gynecological exam 04/03/2018   Vitamin D deficiency 04/08/2017   Diabetes mellitus without complication ([place]) 66/44/0347   ALLERGIC RHINITIS 01/11/2010   Cough 01/11/2010    Past Surgical History:  Procedure Laterality Date   ROBOTIC ASSISTED TOTAL HYSTERECTOMY WITH BILATERAL SALPINGO OOPHERECTOMY Bilateral 06/22/2020   Procedure: XI ROBOTIC ASSISTED TOTAL HYSTERECTOMY WITH BILATERAL SALPINGO OOPHORECTOMY;  Surgeon: Everitt Amber, MD;  Location: [facility];  Service: Gynecology;  Laterality: Bilateral;   SENTINEL NODE BIOPSY N/A 06/22/2020   Procedure: SENTINEL NODE BIOPSY;  Surgeon: Everitt Amber, MD;  Location:  [facility];  Service: Gynecology;  Laterality: N/A;   TUBAL LIGATION  1990    OB History     Gravida  1   Para  1   Term      Preterm      AB      Living  1      SAB      IAB      Ectopic      Multiple      Live Births               Home Medications    Prior to Admission medications   Medication Sig Start Date End Date Taking? Authorizing Provider  azithromycin (ZITHROMAX Z-PAK) 250 MG tablet Take 2 tabs today; then begin one tab once daily for 4 more days. 01/18/22  Yes Kandra Nicolas, MD  predniSONE (DELTASONE) 20 MG tablet Take one tab by mouth twice daily for 4 days, then one daily for 3 days. Take with food. 01/18/22  Yes Kandra Nicolas, MD  Cholecalciferol (VITAMIN D3) 50 MCG (2000 UT) TABS Take 2,000 Units by mouth daily.    [provider]    Family History Family History  Problem Relation Age of Onset   Asthma Other        multiple relatives   Allergies Other        multiple relatives   Diabetes Paternal Grandfather    Hypertension Paternal Grandfather    Heart disease Paternal Grandfather    Diabetes Paternal Grandmother    Hypertension Paternal Grandmother  Heart disease Paternal Grandmother    Diabetes Maternal Grandmother    Hypertension Maternal Grandmother    Heart disease Maternal Grandmother    Diabetes Maternal Grandfather    Hypertension Maternal Grandfather    Heart disease Maternal Grandfather    Diabetes Father    Hypertension Father    Heart disease Father    Hypertension Mother    Diabetes Mother    Heart disease Mother    Cancer Brother        leukemia    Social History Social History   Tobacco Use   Smoking status: Never   Smokeless tobacco: Never  Vaping Use   Vaping Use: Never used  Substance Use Topics   Alcohol use: No   Drug use: No     Allergies   Cephalexin, Moxifloxacin, and Tamiflu [oseltamivir phosphate]   Review of Systems Review of Systems ? sore throat + cough No pleuritic  pain No wheezing + nasal congestion + post-nasal drainage No sinus pain/pressure No itchy/red eyes No earache No hemoptysis No SOB No fever/chills No nausea No vomiting No abdominal pain No diarrhea No urinary symptoms No skin rash No fatigue No myalgias No headache Used OTC meds (Vicks 44) without relief   Physical Exam Triage Vital Signs ED Triage Vitals  Enc Vitals Group     BP 01/18/22 1019 (!) 141/79     Pulse Rate 01/18/22 1019 96     Resp 01/18/22 1019 16     Temp 01/18/22 1019 99.1 F (37.3 C)     Temp Source 01/18/22 1019 Oral     SpO2 01/18/22 1019 95 %     Weight --      Height --      Head Circumference --      Peak Flow --      Pain Score 01/18/22 1021 0     Pain Loc --      Pain Edu? --      Excl. in [place]? --    No data found.  Updated Vital Signs BP (!) 141/79 (BP Location: Right Arm)    Pulse 96    Temp 99.1 F (37.3 C) (Oral)    Resp 16    SpO2 95%   Visual Acuity Right Eye Distance:   Left Eye Distance:   Bilateral Distance:    Right Eye Near:   Left Eye Near:    Bilateral Near:     Physical Exam Nursing notes and Vital Signs reviewed. Appearance:  Patient appears stated age, and in no acute distress Eyes:  Pupils are equal, round, and reactive to light and accomodation.  Extraocular movement is intact.  Conjunctivae are not inflamed  Ears:  Canals normal.  Tympanic membranes normal.  Nose:  Mildly congested turbinates.  No sinus tenderness.   Pharynx:  Normal Neck:  Supple.  Mildly enlarged lateral nodes are present. Lungs:  Clear to auscultation.  Breath sounds are equal.  Moving air well. Heart:  Regular rate and rhythm without murmurs, rubs, or gallops.  Abdomen:  Nontender without masses or hepatosplenomegaly.  Bowel sounds are present.  No CVA or flank tenderness.  Extremities:  No edema.  Skin:  No rash present.   UC Treatments / Results  Labs (all labs ordered are listed, but only abnormal results are displayed) Labs  Reviewed  SARS-COV-2 RNA,(COVID-19) QUALITATIVE NAAT  POC SARS CORONAVIRUS 2 AG -  ED negative    EKG   Radiology No results found.  Procedures Procedures (  including critical care time)  Medications Ordered in UC Medications - No data to display  Initial Impression / Assessment and Plan / UC Course  I have reviewed the triage vital signs and the nursing notes.  Pertinent labs & imaging results that were available during my care of the patient were reviewed by me and considered in my medical decision making (see chart for details).    Benign exam.  Because of history of endometrial CA and history of bronchitis, will begin Z-pak. Because of increased congestion resulting from seasonal rhinitis, begin prednisone burst/taper. COVID19 PCR pending. Followup with Family Doctor if not improved in one week.  Final Clinical Impressions(s) / UC Diagnoses   Final diagnoses:  Viral URI with cough  Seasonal allergic rhinitis, unspecified trigger     Discharge Instructions      Take plain guaifenesin (1200mg  extended release tabs such as Mucinex) twice daily, with plenty of water, for cough and congestion.  May add Pseudoephedrine (30mg , one or two every 4 to 6 hours) for sinus congestion.  Get adequate rest.   May use Afrin nasal spray (or generic oxymetazoline) each morning for about 5 days and then discontinue.  Also recommend using saline nasal spray several times daily and saline nasal irrigation (AYR is a common brand).  Use Flonase nasal spray each morning after using Afrin nasal spray and saline nasal irrigation. Try warm salt water gargles for sore throat.  Stop all antihistamines (Claritin, etc) for now, and other non-prescription cough/cold preparations. May take Tylenol as needed. May take Delsym Cough Suppressant ("12 Hour Cough Relief") at bedtime for nighttime cough.     ED Prescriptions     Medication Sig Dispense Auth. Provider   azithromycin (ZITHROMAX Z-PAK) 250  MG tablet Take 2 tabs today; then begin one tab once daily for 4 more days. 6 tablet Kandra Nicolas, MD   predniSONE (DELTASONE) 20 MG tablet Take one tab by mouth twice daily for 4 days, then one daily for 3 days. Take with food. 11 tablet Kandra Nicolas, MD         Kandra Nicolas, MD 01/20/22 [id number]

## 2022-01-18 NOTE — ED Triage Notes (Signed)
Pt here today c/o cough and congestion that started last night. Husband states cough sounded real barky last night. No known fever. Vicks 44 last night with no relief.   ?

## 2022-01-19 LAB — SARS-COV-2 RNA,(COVID-19) QUALITATIVE NAAT: SARS CoV2 RNA: NOT DETECTED

## 2022-01-22 ENCOUNTER — Other Ambulatory Visit (HOSPITAL_COMMUNITY): Payer: Self-pay | Admitting: Family Medicine

## 2022-01-22 ENCOUNTER — Other Ambulatory Visit: Payer: Self-pay

## 2022-01-22 ENCOUNTER — Ambulatory Visit (HOSPITAL_COMMUNITY)
Admission: RE | Admit: 2022-01-22 | Discharge: 2022-01-22 | Disposition: A | Discharge status: Home / Self Care | Payer: BC Managed Care – PPO | Source: Ambulatory Visit | Attending: Family Medicine | Admitting: Family Medicine

## 2022-01-22 DIAGNOSIS — J069 Acute upper respiratory infection, unspecified: Secondary | ICD-10-CM | POA: Insufficient documentation

## 2022-01-22 DIAGNOSIS — R059 Cough, unspecified: Secondary | ICD-10-CM | POA: Diagnosis not present

## 2022-01-28 DIAGNOSIS — R059 Cough, unspecified: Secondary | ICD-10-CM | POA: Diagnosis not present

## 2022-01-28 DIAGNOSIS — J069 Acute upper respiratory infection, unspecified: Secondary | ICD-10-CM | POA: Diagnosis not present

## 2022-02-04 ENCOUNTER — Other Ambulatory Visit (HOSPITAL_COMMUNITY): Payer: Self-pay | Admitting: Family Medicine

## 2022-02-04 DIAGNOSIS — J069 Acute upper respiratory infection, unspecified: Secondary | ICD-10-CM

## 2022-02-15 ENCOUNTER — Ambulatory Visit (HOSPITAL_COMMUNITY)
Admission: RE | Admit: 2022-02-15 | Discharge: 2022-02-15 | Disposition: A | Discharge status: Home / Self Care | Payer: BC Managed Care – PPO | Source: Ambulatory Visit | Attending: Family Medicine | Admitting: Family Medicine

## 2022-02-15 DIAGNOSIS — J069 Acute upper respiratory infection, unspecified: Secondary | ICD-10-CM | POA: Diagnosis not present

## 2022-02-15 DIAGNOSIS — J22 Unspecified acute lower respiratory infection: Secondary | ICD-10-CM | POA: Diagnosis not present

## 2022-03-01 DIAGNOSIS — E782 Mixed hyperlipidemia: Secondary | ICD-10-CM | POA: Diagnosis not present

## 2022-03-01 DIAGNOSIS — E559 Vitamin D deficiency, unspecified: Secondary | ICD-10-CM | POA: Diagnosis not present

## 2022-03-01 DIAGNOSIS — R7303 Prediabetes: Secondary | ICD-10-CM | POA: Diagnosis not present

## 2022-03-06 DIAGNOSIS — E782 Mixed hyperlipidemia: Secondary | ICD-10-CM | POA: Diagnosis not present

## 2022-03-06 DIAGNOSIS — C55 Malignant neoplasm of uterus, part unspecified: Secondary | ICD-10-CM | POA: Diagnosis not present

## 2022-03-06 DIAGNOSIS — E559 Vitamin D deficiency, unspecified: Secondary | ICD-10-CM | POA: Diagnosis not present

## 2022-03-06 DIAGNOSIS — R7303 Prediabetes: Secondary | ICD-10-CM | POA: Diagnosis not present

## 2022-03-12 ENCOUNTER — Encounter (INDEPENDENT_AMBULATORY_CARE_PROVIDER_SITE_OTHER): Payer: Self-pay | Admitting: *Deleted

## 2022-03-26 ENCOUNTER — Other Ambulatory Visit (HOSPITAL_COMMUNITY): Payer: Self-pay | Admitting: Internal Medicine

## 2022-03-26 DIAGNOSIS — Z1231 Encounter for screening mammogram for malignant neoplasm of breast: Secondary | ICD-10-CM

## 2022-04-08 DIAGNOSIS — H5213 Myopia, bilateral: Secondary | ICD-10-CM | POA: Diagnosis not present

## 2022-04-08 DIAGNOSIS — H524 Presbyopia: Secondary | ICD-10-CM | POA: Diagnosis not present

## 2022-04-08 DIAGNOSIS — E1136 Type 2 diabetes mellitus with diabetic cataract: Secondary | ICD-10-CM | POA: Diagnosis not present

## 2022-04-08 DIAGNOSIS — H25813 Combined forms of age-related cataract, bilateral: Secondary | ICD-10-CM | POA: Diagnosis not present

## 2022-04-22 ENCOUNTER — Ambulatory Visit (HOSPITAL_COMMUNITY): Payer: BC Managed Care – PPO

## 2022-04-25 ENCOUNTER — Ambulatory Visit (HOSPITAL_COMMUNITY)
Admission: RE | Admit: 2022-04-25 | Discharge: 2022-04-25 | Disposition: A | Discharge status: Home / Self Care | Payer: BC Managed Care – PPO | Source: Ambulatory Visit | Attending: Internal Medicine | Admitting: Internal Medicine

## 2022-04-25 DIAGNOSIS — Z1231 Encounter for screening mammogram for malignant neoplasm of breast: Secondary | ICD-10-CM | POA: Diagnosis not present

## 2022-07-02 ENCOUNTER — Encounter: Payer: Self-pay | Admitting: Adult Health

## 2022-07-02 ENCOUNTER — Ambulatory Visit (INDEPENDENT_AMBULATORY_CARE_PROVIDER_SITE_OTHER): Payer: BC Managed Care – PPO | Admitting: Adult Health

## 2022-07-02 VITALS — BP 143/71 | HR 60 | Ht 64.0 in | Wt 199.5 lb

## 2022-07-02 DIAGNOSIS — E559 Vitamin D deficiency, unspecified: Secondary | ICD-10-CM | POA: Diagnosis not present

## 2022-07-02 DIAGNOSIS — Z01419 Encounter for gynecological examination (general) (routine) without abnormal findings: Secondary | ICD-10-CM

## 2022-07-02 DIAGNOSIS — Z1211 Encounter for screening for malignant neoplasm of colon: Secondary | ICD-10-CM | POA: Diagnosis not present

## 2022-07-02 DIAGNOSIS — E782 Mixed hyperlipidemia: Secondary | ICD-10-CM | POA: Diagnosis not present

## 2022-07-02 DIAGNOSIS — R7303 Prediabetes: Secondary | ICD-10-CM | POA: Diagnosis not present

## 2022-07-02 DIAGNOSIS — C541 Malignant neoplasm of endometrium: Secondary | ICD-10-CM

## 2022-07-02 LAB — HEMOCCULT GUIAC POC 1CARD (OFFICE): Fecal Occult Blood, POC: NEGATIVE

## 2022-07-02 NOTE — Progress Notes (Signed)
Patient ID: Molly Weeks, female   DOB: [birth year], 66 y.o.   MRN: [id number] History of Present Illness: Molly Weeks is a 66 year old white female,married, sp robotic laparoscopic hysterectomy, 06/22/20 for stage 1 grade, endometrioid adenocarcinoma of the uterus.She saw Dr Delsa Sale 6 months ago. She will get an exam every 6 months for 5 years, and this is her well woman physical . Her husband is still having health issues.  PCP is Dr Nevada Crane.   Current Medications, Allergies, Past Medical History, Past Surgical History, Family History and Social History were reviewed in [employer] record.     Review of Systems: Patient denies any headaches, hearing loss, fatigue, blurred vision, shortness of breath, chest pain, abdominal pain, problems with bowel movements, urination, or intercourse. (Not active).No joint pain or mood swings.  Denies any vaginal spotting.      Physical Exam:BP (!) 143/71 (BP Location: Left Arm, Patient Position: Sitting, Cuff Size: Normal)   Pulse 60   Ht '5\' 4"'$  (1.626 m)   Wt 199 lb 8 oz (90.5 kg)   BMI 34.24 kg/m   she has lost 10 lbs since last year  General:  Well developed, well nourished, no acute distress Skin:  Warm and dry Neck:  Midline trachea, normal thyroid, good ROM, no lymphadenopathy,no carotid bruits heard  Lungs; Clear to auscultation bilaterally Breast:  No dominant palpable mass, retraction, or nipple discharge Cardiovascular: Regular rate and rhythm Abdomen:  Soft, non tender, no hepatosplenomegaly Pelvic:  External genitalia is normal in appearance, no lesions.  The vagina is pale, no lesions, and vaginal cuff looks good. Urethra has no lesions or masses. The cervix and uterus are absent. No adnexal masses or tenderness noted.Bladder is non tender, no masses felt. Rectal: Good sphincter tone, no polyps, or hemorrhoids felt.  Hemoccult negative. Extremities/musculoskeletal:  No swelling or varicosities noted, no clubbing or  cyanosis Psych:  No mood changes, alert and cooperative,seems happy AA is 0 Fall risk is low    07/02/2022    1:35 PM 06/25/2021    8:50 AM 05/11/2020    9:08 AM  Depression screen PHQ 2/9  Decreased Interest 0 0 0  Down, Depressed, Hopeless 0 0 0  PHQ - 2 Score 0 0 0  Altered sleeping 0 0 0  Tired, decreased energy 0 0 0  Change in appetite 0 0 0  Feeling bad or failure about yourself  0 0 0  Trouble concentrating 0 0 0  Moving slowly or fidgety/restless 0 0 0  Suicidal thoughts 0 0 0  PHQ-9 Score 0 0 0       07/02/2022    1:35 PM 06/25/2021    8:50 AM 05/11/2020    9:09 AM  GAD 7 : Generalized Anxiety Score  Nervous, Anxious, on Edge 0 0 0  Control/stop worrying 0 0 0  Worry too much - different things 0 0 0  Trouble relaxing 0 0 0  Restless 0 0 0  Easily annoyed or irritable 0 0 0  Afraid - awful might happen 0 0 0  Total GAD 7 Score 0 0 0    Upstream - 07/02/22 1340       Pregnancy Intention Screening   Does the patient want to become pregnant in the next year? No    Does the patient's partner want to become pregnant in the next year? No    Would the patient like to discuss contraceptive options today? No  Contraception Wrap Up   Current Method Female Sterilization   hyst   End Method Female Sterilization   hyst   Contraception Counseling Provided No            Examination chaperoned by Levy Pupa LPN    Impression and Plan: 1. Well woman exam with routine gynecological exam Physical in 1 year Mammogram was normal 6.8/23 Labs today with PCP Needs colonoscopy  2. Endometrial cancer [facility]) See Dr Delsa Sale or Dr Berline Lopes in 6 months  3. Encounter for screening fecal occult blood testing Hemoccult was negative

## 2022-07-09 DIAGNOSIS — E559 Vitamin D deficiency, unspecified: Secondary | ICD-10-CM | POA: Diagnosis not present

## 2022-07-09 DIAGNOSIS — C55 Malignant neoplasm of uterus, part unspecified: Secondary | ICD-10-CM | POA: Diagnosis not present

## 2022-07-09 DIAGNOSIS — M8580 Other specified disorders of bone density and structure, unspecified site: Secondary | ICD-10-CM | POA: Diagnosis not present

## 2022-07-09 DIAGNOSIS — E782 Mixed hyperlipidemia: Secondary | ICD-10-CM | POA: Diagnosis not present

## 2022-07-10 ENCOUNTER — Telehealth: Payer: Self-pay | Admitting: Adult Health

## 2022-07-10 NOTE — Telephone Encounter (Signed)
Pt aware I got copy of labs will scan in

## 2022-08-14 DIAGNOSIS — J069 Acute upper respiratory infection, unspecified: Secondary | ICD-10-CM | POA: Diagnosis not present

## 2022-08-20 ENCOUNTER — Encounter (INDEPENDENT_AMBULATORY_CARE_PROVIDER_SITE_OTHER): Payer: Self-pay | Admitting: *Deleted

## 2022-08-21 DIAGNOSIS — J069 Acute upper respiratory infection, unspecified: Secondary | ICD-10-CM | POA: Diagnosis not present

## 2022-10-04 DIAGNOSIS — Z8542 Personal history of malignant neoplasm of other parts of uterus: Secondary | ICD-10-CM | POA: Diagnosis not present

## 2022-10-04 DIAGNOSIS — E6609 Other obesity due to excess calories: Secondary | ICD-10-CM | POA: Diagnosis not present

## 2022-10-04 DIAGNOSIS — R7301 Impaired fasting glucose: Secondary | ICD-10-CM | POA: Diagnosis not present

## 2022-10-04 DIAGNOSIS — Z1211 Encounter for screening for malignant neoplasm of colon: Secondary | ICD-10-CM | POA: Diagnosis not present

## 2022-10-04 DIAGNOSIS — E119 Type 2 diabetes mellitus without complications: Secondary | ICD-10-CM | POA: Diagnosis not present

## 2022-12-31 ENCOUNTER — Encounter: Payer: Self-pay | Admitting: Obstetrics & Gynecology

## 2023-01-01 ENCOUNTER — Inpatient Hospital Stay: Payer: BC Managed Care – PPO | Attending: Obstetrics & Gynecology | Admitting: Obstetrics & Gynecology

## 2023-01-01 ENCOUNTER — Encounter: Payer: Self-pay | Admitting: Obstetrics & Gynecology

## 2023-01-01 VITALS — BP 152/80 | HR 78 | Temp 98.3°F | Resp 20 | Ht 62.0 in | Wt 206.0 lb

## 2023-01-01 DIAGNOSIS — Z8542 Personal history of malignant neoplasm of other parts of uterus: Secondary | ICD-10-CM | POA: Diagnosis not present

## 2023-01-01 DIAGNOSIS — C541 Malignant neoplasm of endometrium: Secondary | ICD-10-CM

## 2023-01-01 NOTE — Assessment & Plan Note (Signed)
89 .o.  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  Negative symptom review, normal exam.  No evidence of recurrence   >recommend she have follow-up every 6 months for 5 years in accordance with NCCN guidelines.  Alternate visits /generalist gynecologist

## 2023-01-01 NOTE — Progress Notes (Signed)
Follow Up Note: Gyn-Onc  Molly Weeks 67 y.o. female  CC: She presents for a f/u visit   HPI: The oncology history was reviewed.  Interval History: She denies any vaginal bleeding, abdominal/pelvic pain, cough, lethargy or increasing abdominal girth.   Review of Systems  Review of Systems  Constitutional:  Negative for malaise/fatigue and weight loss.  Respiratory:  Negative for shortness of breath and wheezing.   Cardiovascular:  Negative for chest pain and leg swelling.  Gastrointestinal:  Negative for abdominal pain, blood in stool, constipation, nausea and vomiting.  Genitourinary:  Negative for dysuria, frequency, hematuria and urgency.  Musculoskeletal:  Negative for joint pain and myalgias.  Neurological:  Negative for weakness.  Psychiatric/Behavioral:  Negative for depression. The patient does not have insomnia.    Current medications, allergy, social history, past surgical history, past medical history, family history were all reviewed.    Vitals:  BP (!) 152/80 (BP Location: Left Arm) Comment: MD notified  Pulse 78   Temp 98.3 F (36.8 C) (Oral)   Resp 20   Ht 5' 2"$  (1.575 m)   Wt 206 lb (93.4 kg)   BMI 37.68 kg/m    Physical Exam:  Physical Exam Exam conducted with a chaperone present.  Constitutional:      General: She is not in acute distress. Cardiovascular:     Rate and Rhythm: Normal rate and regular rhythm.  Pulmonary:     Effort: Pulmonary effort is normal.     Breath sounds: Normal breath sounds. No wheezing or rhonchi.  Abdominal:     Palpations: Abdomen is soft.     Tenderness: There is no abdominal tenderness. There is no right CVA tenderness or left CVA tenderness.     Hernia: No hernia is present.  Genitourinary:    General: Normal vulva.     Urethra: No urethral lesion.     Vagina: No lesions. No bleeding Musculoskeletal:     Cervical back: Neck supple.     Right lower leg: No edema.     Left lower leg: No edema.  Lymphadenopathy:      Upper Body:     Right upper body: No supraclavicular adenopathy.     Left upper body: No supraclavicular adenopathy.     Lower Body: No right inguinal adenopathy. No left inguinal adenopathy.  Skin:    Findings: No rash.  Neurological:     Mental Status: She is oriented to person, place, and time.   Assessment/Plan:  Endometrial cancer ([place]) 66 .o.  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  Negative symptom review, normal exam.  No evidence of recurrence   >recommend she have follow-up every 6 months for 5 years in accordance with NCCN guidelines.  Alternate visits /generalist gynecologist    I personally spent 25 minutes face-to-face and non-face-to-face in the care of this patient, which includes all pre, intra, and post visit time on the date of service.    Lahoma Crocker, MD

## 2023-01-01 NOTE — Patient Instructions (Signed)
Return in 1 year ?

## 2023-03-18 ENCOUNTER — Other Ambulatory Visit (HOSPITAL_COMMUNITY): Payer: Self-pay | Admitting: Adult Health

## 2023-03-18 DIAGNOSIS — Z1231 Encounter for screening mammogram for malignant neoplasm of breast: Secondary | ICD-10-CM

## 2023-04-10 DIAGNOSIS — H524 Presbyopia: Secondary | ICD-10-CM | POA: Diagnosis not present

## 2023-04-10 DIAGNOSIS — H5213 Myopia, bilateral: Secondary | ICD-10-CM | POA: Diagnosis not present

## 2023-04-10 DIAGNOSIS — H25813 Combined forms of age-related cataract, bilateral: Secondary | ICD-10-CM | POA: Diagnosis not present

## 2023-04-10 DIAGNOSIS — E1136 Type 2 diabetes mellitus with diabetic cataract: Secondary | ICD-10-CM | POA: Diagnosis not present

## 2023-04-30 ENCOUNTER — Encounter (HOSPITAL_COMMUNITY): Payer: Self-pay

## 2023-04-30 ENCOUNTER — Ambulatory Visit (HOSPITAL_COMMUNITY)
Admission: RE | Admit: 2023-04-30 | Discharge: 2023-04-30 | Disposition: A | Discharge status: Home / Self Care | Payer: BC Managed Care – PPO | Source: Ambulatory Visit | Attending: Adult Health | Admitting: Adult Health

## 2023-04-30 DIAGNOSIS — Z1231 Encounter for screening mammogram for malignant neoplasm of breast: Secondary | ICD-10-CM | POA: Diagnosis not present

## 2023-07-07 DIAGNOSIS — Z0000 Encounter for general adult medical examination without abnormal findings: Secondary | ICD-10-CM | POA: Diagnosis not present

## 2023-07-07 DIAGNOSIS — E559 Vitamin D deficiency, unspecified: Secondary | ICD-10-CM | POA: Diagnosis not present

## 2023-07-07 DIAGNOSIS — E1165 Type 2 diabetes mellitus with hyperglycemia: Secondary | ICD-10-CM | POA: Diagnosis not present

## 2023-07-07 DIAGNOSIS — Z1330 Encounter for screening examination for mental health and behavioral disorders, unspecified: Secondary | ICD-10-CM | POA: Diagnosis not present

## 2023-07-07 DIAGNOSIS — Z1211 Encounter for screening for malignant neoplasm of colon: Secondary | ICD-10-CM | POA: Diagnosis not present

## 2023-07-07 DIAGNOSIS — E6609 Other obesity due to excess calories: Secondary | ICD-10-CM | POA: Diagnosis not present

## 2023-07-08 ENCOUNTER — Encounter: Payer: Self-pay | Admitting: Adult Health

## 2023-07-08 ENCOUNTER — Ambulatory Visit (INDEPENDENT_AMBULATORY_CARE_PROVIDER_SITE_OTHER): Payer: BC Managed Care – PPO | Admitting: Adult Health

## 2023-07-08 VITALS — BP 129/80 | HR 72 | Ht 64.0 in | Wt 203.5 lb

## 2023-07-08 DIAGNOSIS — Z01419 Encounter for gynecological examination (general) (routine) without abnormal findings: Secondary | ICD-10-CM | POA: Diagnosis not present

## 2023-07-08 DIAGNOSIS — Z90710 Acquired absence of both cervix and uterus: Secondary | ICD-10-CM

## 2023-07-08 DIAGNOSIS — Z1211 Encounter for screening for malignant neoplasm of colon: Secondary | ICD-10-CM | POA: Diagnosis not present

## 2023-07-08 DIAGNOSIS — C541 Malignant neoplasm of endometrium: Secondary | ICD-10-CM

## 2023-07-08 DIAGNOSIS — Z90721 Acquired absence of ovaries, unilateral: Secondary | ICD-10-CM

## 2023-07-08 LAB — HEMOCCULT GUIAC POC 1CARD (OFFICE): Fecal Occult Blood, POC: NEGATIVE

## 2023-07-08 NOTE — Progress Notes (Signed)
Patient ID: Molly Weeks, female   DOB: 11-13-1956, 67 y.o.   MRN: [id number] History of Present Illness: Molly Weeks is a 67 year old white female,married, sp  robotic laparoscopic hysterectomy, 06/22/20 for stage 1A grade 2 endometrioid adenocarcinoma of the uterus.She saw Dr Tamela Oddi 6 months ago. She will get an exam every 6 months for 5 years, and this is her well woman gyn exam physical and 6 month check. She had a physical  and labs with PCP yesterday.  Her husband is still having health issues.   PCP is Ronny Bacon NP   Current Medications, Allergies, Past Medical History, Past Surgical History, Family History and Social History were reviewed in [employer] record.     Review of Systems: Patient denies any headaches, hearing loss, fatigue, blurred vision, shortness of breath, chest pain, abdominal pain, problems with bowel movements, urination, or intercourse(not active). No joint pain or mood swings.  Denies any vaginal bleeding    Physical Exam:BP 129/80 (BP Location: Right Arm, Patient Position: Sitting, Cuff Size: Normal)   Pulse 72   Ht 5\' 4"  (1.626 m)   Wt 203 lb 8 oz (92.3 kg)   BMI 34.93 kg/m   General:  Well developed, well nourished, no acute distress Skin:  Warm and dry Neck:  Midline trachea, normal thyroid, good ROM, no lymphadenopathy,no carotid bruits heard Lungs; Clear to auscultation bilaterally Breast:  No dominant palpable mass, retraction, or nipple discharge Cardiovascular: Regular rate and rhythm Abdomen:  Soft, non tender, no hepatosplenomegaly Pelvic:  External genitalia is normal in appearance, no lesions.  The vagina is pale, no lesions noted.  Urethra has no lesions or masses. The cervix and uterus are absent. No adnexal masses or tenderness noted.Bladder is non tender, no masses felt. Rectal: Good sphincter tone, no polyps, or hemorrhoids felt.  Hemoccult negative. Extremities/musculoskeletal:  No swelling or varicosities noted, no  clubbing or cyanosis Psych:  No mood changes, alert and cooperative,seems happy AA is 0 Fall risk is low    07/08/2023    8:39 AM 07/02/2022    1:35 PM 06/25/2021    8:50 AM  Depression screen PHQ 2/9  Decreased Interest 0 0 0  Down, Depressed, Hopeless 0 0 0  PHQ - 2 Score 0 0 0  Altered sleeping 0 0 0  Tired, decreased energy 0 0 0  Change in appetite 0 0 0  Feeling bad or failure about yourself  0 0 0  Trouble concentrating 0 0 0  Moving slowly or fidgety/restless 0 0 0  Suicidal thoughts 0 0 0  PHQ-9 Score 0 0 0       07/08/2023    8:39 AM 07/02/2022    1:35 PM 06/25/2021    8:50 AM 05/11/2020    9:09 AM  GAD 7 : Generalized Anxiety Score  Nervous, Anxious, on Edge 0 0 0 0  Control/stop worrying 0 0 0 0  Worry too much - different things 0 0 0 0  Trouble relaxing 0 0 0 0  Restless 0 0 0 0  Easily annoyed or irritable 0 0 0 0  Afraid - awful might happen 0 0 0 0  Total GAD 7 Score 0 0 0 0      Upstream - 07/08/23 0839       Pregnancy Intention Screening   Does the patient want to become pregnant in the next year? N/A    Does the patient's partner want to become pregnant in the next  year? N/A    Would the patient like to discuss contraceptive options today? N/A      Contraception Wrap Up   Current Method Female Sterilization   hyst   End Method Female Sterilization   hyst   Contraception Counseling Provided No            Examination chaperoned by Malachy Mood LPN  Impression and plan:  1. Well woman exam with routine gynecological exam GYN physical in 1 year Labs with PCP Mammogram was negative 04/30/23 To get colonoscopy in near future Physical with PCP  2. Endometrial cancer ([facility]) No lesions noted today on exam Denies any bleeding  Follow up in February 2025 with Dr Tamela Oddi  3. Encounter for screening fecal occult blood testing Hemoccult was negative  - POCT occult blood stool  4. S/P hysterectomy with oophorectomy  robotic laparoscopic  hysterectomy, 06/22/20 for stage 1A grade 2 endometrioid adenocarcinoma of the uterus No lesions seen on exam today Denies any bleeding

## 2023-07-14 ENCOUNTER — Encounter (INDEPENDENT_AMBULATORY_CARE_PROVIDER_SITE_OTHER): Payer: Self-pay | Admitting: *Deleted

## 2023-07-31 ENCOUNTER — Telehealth (INDEPENDENT_AMBULATORY_CARE_PROVIDER_SITE_OTHER): Payer: Self-pay | Admitting: Gastroenterology

## 2023-07-31 NOTE — Telephone Encounter (Signed)
Who is your primary care physician: Roe Rutherford  Reasons for the colonoscopy: Screening  Have you had a colonoscopy before?  no  Do you have family history of colon cancer? no  Previous colonoscopy with polyps removed? no  Do you have a history colorectal cancer?   no  Are you diabetic? If yes, Type 1 or Type 2?    Yes type 2  Do you have a prosthetic or mechanical heart valve? no  Do you have a pacemaker/defibrillator?   no  Have you had endocarditis/atrial fibrillation? no  Have you had joint replacement within the last 12 months?  no  Do you tend to be constipated or have to use laxatives? no  Do you have any history of drugs or alchohol?  no  Do you use supplemental oxygen?  no  Have you had a stroke or heart attack within the last 6 months? no  Do you take weight loss medication?  no  For female patients: have you had a hysterectomy?  yes                                     are you post menopausal?       yes                                            do you still have your menstrual cycle? no      Do you take any blood-thinning medications such as: (aspirin, warfarin, Plavix, Aggrenox)  no  If yes we need the name, milligram, dosage and who is prescribing doctor  Current Outpatient Medications on File Prior to Visit  Medication Sig Dispense Refill   Cholecalciferol (VITAMIN D3) 50 MCG (2000 UT) TABS Take 2,000 Units by mouth daily.     No current facility-administered medications on file prior to visit.    Allergies  Allergen Reactions   Cephalexin Diarrhea and Nausea And Vomiting   Moxifloxacin Rash    fever   Tamiflu [Oseltamivir Phosphate]     Severe nausea right after taking in 2018     Pharmacy: [facility]  Primary Insurance Name: Ezequiel Essex   Best number where you can be reached: [phone]

## 2023-07-31 NOTE — Telephone Encounter (Signed)
Will call to schedule once complete October schedule is out.

## 2023-07-31 NOTE — Telephone Encounter (Signed)
Room 1 Thanks 

## 2023-08-15 MED ORDER — NA SULFATE-K SULFATE-MG SULF 17.5-3.13-1.6 GM/177ML PO SOLN: ORAL | 0 refills | Status: DC

## 2023-08-15 NOTE — Addendum Note (Signed)
Addended by: Armstead Peaks on: 08/15/2023 09:47 AM   Modules accepted: Orders

## 2023-08-15 NOTE — Telephone Encounter (Signed)
Spoke with pt. Scheduled for 10/16. Aware will send instructions and rx to pharmacy.

## 2023-08-19 ENCOUNTER — Encounter (INDEPENDENT_AMBULATORY_CARE_PROVIDER_SITE_OTHER): Payer: Self-pay | Admitting: *Deleted

## 2023-08-19 NOTE — Telephone Encounter (Signed)
Referral completed, [facility] apt letter sent to PCP

## 2023-09-03 ENCOUNTER — Other Ambulatory Visit: Payer: Self-pay

## 2023-09-03 ENCOUNTER — Encounter (HOSPITAL_COMMUNITY): Payer: Self-pay | Admitting: Gastroenterology

## 2023-09-03 ENCOUNTER — Ambulatory Visit (HOSPITAL_COMMUNITY): Payer: BC Managed Care – PPO | Admitting: Anesthesiology

## 2023-09-03 ENCOUNTER — Ambulatory Visit (HOSPITAL_COMMUNITY)
Admission: RE | Admit: 2023-09-03 | Discharge: 2023-09-03 | Disposition: A | Discharge status: Home / Self Care | Payer: BC Managed Care – PPO | Attending: Gastroenterology | Admitting: Gastroenterology

## 2023-09-03 ENCOUNTER — Encounter (HOSPITAL_COMMUNITY)
Admission: RE | Disposition: A | Discharge status: Home / Self Care | Payer: Self-pay | Source: Home / Self Care | Attending: Gastroenterology

## 2023-09-03 DIAGNOSIS — Z1211 Encounter for screening for malignant neoplasm of colon: Secondary | ICD-10-CM

## 2023-09-03 DIAGNOSIS — D122 Benign neoplasm of ascending colon: Secondary | ICD-10-CM

## 2023-09-03 DIAGNOSIS — K648 Other hemorrhoids: Secondary | ICD-10-CM | POA: Insufficient documentation

## 2023-09-03 DIAGNOSIS — K635 Polyp of colon: Secondary | ICD-10-CM | POA: Diagnosis not present

## 2023-09-03 DIAGNOSIS — E119 Type 2 diabetes mellitus without complications: Secondary | ICD-10-CM | POA: Insufficient documentation

## 2023-09-03 DIAGNOSIS — D120 Benign neoplasm of cecum: Secondary | ICD-10-CM | POA: Diagnosis not present

## 2023-09-03 DIAGNOSIS — K562 Volvulus: Secondary | ICD-10-CM

## 2023-09-03 HISTORY — PX: POLYPECTOMY: SHX5525

## 2023-09-03 HISTORY — PX: COLONOSCOPY WITH PROPOFOL: SHX5780

## 2023-09-03 LAB — GLUCOSE, CAPILLARY: Glucose-Capillary: 139 mg/dL — ABNORMAL HIGH (ref 70–99)

## 2023-09-03 LAB — HM COLONOSCOPY

## 2023-09-03 SURGERY — COLONOSCOPY WITH PROPOFOL
Anesthesia: General

## 2023-09-03 MED ORDER — PHENYLEPHRINE 80 MCG/ML (10ML) SYRINGE FOR IV PUSH (FOR BLOOD PRESSURE SUPPORT)
PREFILLED_SYRINGE | INTRAVENOUS | Status: AC
  Filled 2023-09-03: qty 10

## 2023-09-03 MED ORDER — PHENYLEPHRINE 80 MCG/ML (10ML) SYRINGE FOR IV PUSH (FOR BLOOD PRESSURE SUPPORT)
PREFILLED_SYRINGE | INTRAVENOUS | Status: DC | PRN
  Administered 2023-09-03 (×3): 160 ug via INTRAVENOUS

## 2023-09-03 MED ORDER — PROPOFOL 500 MG/50ML IV EMUL
INTRAVENOUS | Status: DC | PRN
  Administered 2023-09-03: 150 ug/kg/min via INTRAVENOUS

## 2023-09-03 MED ORDER — LACTATED RINGERS IV SOLN
INTRAVENOUS | Status: DC
  Administered 2023-09-03: 500 mL via INTRAVENOUS

## 2023-09-03 MED ORDER — LIDOCAINE HCL (CARDIAC) PF 100 MG/5ML IV SOSY
PREFILLED_SYRINGE | INTRAVENOUS | Status: DC | PRN
  Administered 2023-09-03: 50 mg via INTRAVENOUS

## 2023-09-03 MED ORDER — PROPOFOL 10 MG/ML IV BOLUS
INTRAVENOUS | Status: DC | PRN
  Administered 2023-09-03: 100 mg via INTRAVENOUS
  Administered 2023-09-03 (×2): 50 mg via INTRAVENOUS

## 2023-09-03 MED ORDER — LACTATED RINGERS IV SOLN: INTRAVENOUS | Status: DC

## 2023-09-03 NOTE — Op Note (Signed)
[facility] Patient Name: Molly Weeks Procedure Date: 09/03/2023 9:50 AM MRN: [id number] Date of Birth: 25-May-1956 Attending MD: Katrinka Blazing , , [id number] CSN: [id number] Age: 67 Admit Type: Outpatient Procedure:                Colonoscopy Indications:              Screening for colorectal malignant neoplasm Providers:                Katrinka Blazing, Edrick Kins, RN, Francoise Ceo RN,                            RN, Volanda Napoleon., Technician Referring MD:              Medicines:                Monitored Anesthesia Care Complications:            No immediate complications. Estimated Blood Loss:     Estimated blood loss: none. Procedure:                Pre-Anesthesia Assessment:                           - Prior to the procedure, a History and Physical                            was performed, and patient medications, allergies                            and sensitivities were reviewed. The patient's                            tolerance of previous anesthesia was reviewed.                           - The risks and benefits of the procedure and the                            sedation options and risks were discussed with the                            patient. All questions were answered and informed                            consent was obtained.                           - ASA Grade Assessment: II - A patient with mild                            systemic disease.                           After obtaining informed consent, the colonoscope                            was passed under direct vision.  Throughout the                            procedure, the patient's blood pressure, pulse, and                            oxygen saturations were monitored continuously. The                            [id number]-[id number] ([id number]) scope was introduced through                            the anus and advanced to the the cecum, identified                            by appendiceal  orifice and ileocecal valve. The                            colonoscopy was performed without difficulty. The                            patient tolerated the procedure well. The quality                            of the bowel preparation was adequate. Scope In: 10:15:46 AM Scope Out: 10:48:32 AM Scope Withdrawal Time: 0 hours 19 minutes 1 second  Total Procedure Duration: 0 hours 32 minutes 46 seconds  Findings:      The perianal and digital rectal examinations were normal.      Three sessile polyps were found in the cecum. The polyps were 1 mm in       size. These polyps were removed with a cold biopsy forceps. Resection       and retrieval were complete.      Three sessile polyps were found in the ascending colon and cecum. The       polyps were 3 to 6 mm in size. These polyps were removed with a cold       snare. Resection and retrieval were complete.      The ascending colon revealed significantly excessive looping. Four-hand       abdominal pressure was applied in the periumbilical area to effectively       advance the scope.      Non-bleeding internal hemorrhoids were found during retroflexion. The       hemorrhoids were small. Impression:               - Three 1 mm polyps in the cecum, removed with a                            cold biopsy forceps. Resected and retrieved.                           - Three 3 to 6 mm polyps in the ascending colon and                            in the cecum, removed with  a cold snare. Resected                            and retrieved.                           - There was significant looping of the colon.                           - Non-bleeding internal hemorrhoids. Moderate Sedation:      Per Anesthesia Care Recommendation:           - Discharge patient to home (ambulatory).                           - Resume previous diet.                           - Await pathology results.                           - Repeat colonoscopy date to be determined  after                            pending pathology results are reviewed for                            surveillance. Procedure Code(s):        --- Professional ---                           [phone], Colonoscopy, flexible; with removal of                            tumor(s), polyp(s), or other lesion(s) by snare                            technique                           45380, 59, Colonoscopy, flexible; with biopsy,                            single or multiple Diagnosis Code(s):        --- Professional ---                           Z12.11, Encounter for screening for malignant                            neoplasm of colon                           D12.2, Benign neoplasm of ascending colon                           D12.0, Benign neoplasm of cecum  K64.8, Other hemorrhoids CPT copyright 2022 [employer]. All rights reserved. The codes documented in this report are preliminary and upon coder review may  be revised to meet current compliance requirements. Katrinka Blazing, MD Katrinka Blazing,  09/03/2023 11:00:28 AM This report has been signed electronically. Number of Addenda: 0

## 2023-09-03 NOTE — H&P (Signed)
Molly Weeks is an 67 y.o. female.   Chief Complaint: screening colonoscopy HPI: 66 year old female with past medical history of diabetes, gynecologic cancer, diabetes, coming for screening colonoscopy. The patient has never had a colonoscopy in the past.  The patient denies having any complaints such as melena, hematochezia, abdominal pain or distention, change in her bowel movement consistency or frequency, no changes in weight recently.  No family history of colorectal cancer.   Past Medical History:  Diagnosis Date   Allergic rhinitis    Cancer (HCC)    endometrial   Diabetes mellitus without complication (HCC)    Vitamin D deficiency 04/08/2017    Past Surgical History:  Procedure Laterality Date   ROBOTIC ASSISTED TOTAL HYSTERECTOMY WITH BILATERAL SALPINGO OOPHERECTOMY Bilateral 06/22/2020   Procedure: XI ROBOTIC ASSISTED TOTAL HYSTERECTOMY WITH BILATERAL SALPINGO OOPHORECTOMY;  Surgeon: Adolphus Birchwood, MD;  Location: [facility];  Service: Gynecology;  Laterality: Bilateral;   SENTINEL NODE BIOPSY N/A 06/22/2020   Procedure: SENTINEL NODE BIOPSY;  Surgeon: Adolphus Birchwood, MD;  Location: [facility];  Service: Gynecology;  Laterality: N/A;   TUBAL LIGATION  1990    Family History  Problem Relation Age of Onset   Asthma Other        multiple relatives   Allergies Other        multiple relatives   Diabetes Paternal Grandfather    Hypertension Paternal Grandfather    Heart disease Paternal Grandfather    Diabetes Paternal Grandmother    Hypertension Paternal Grandmother    Heart disease Paternal Grandmother    Diabetes Maternal Grandmother    Hypertension Maternal Grandmother    Heart disease Maternal Grandmother    Diabetes Maternal Grandfather    Hypertension Maternal Grandfather    Heart disease Maternal Grandfather    Diabetes Father    Hypertension Father    Heart disease Father    Hypertension Mother    Diabetes Mother    Heart disease Mother    Cancer Brother        leukemia    Social History:  reports that she has never smoked. She has never used smokeless tobacco. She reports that she does not drink alcohol and does not use drugs.  Allergies:  Allergies  Allergen Reactions   Cephalexin Diarrhea and Nausea And Vomiting   Moxifloxacin Rash    fever   Tamiflu [Oseltamivir Phosphate]     Severe nausea right after taking in 2018    Medications Prior to Admission  Medication Sig Dispense Refill   Cholecalciferol (VITAMIN D3) 50 MCG (2000 UT) TABS Take 2,000 Units by mouth daily.     Na Sulfate-K Sulfate-Mg Sulf 17.5-3.13-1.6 GM/177ML SOLN As directed 354 mL 0    Results for orders placed or performed during the hospital encounter of 09/03/23 (from the past 48 hour(s))  Glucose, capillary     Status: Abnormal   Collection Time: 09/03/23  9:30 AM  Result Value Ref Range   Glucose-Capillary 139 (H) 70 - 99 mg/dL    Comment: Glucose reference range applies only to samples taken after fasting for at least 8 hours.   No results found.  Review of Systems  All other systems reviewed and are negative.   Blood pressure (!) 159/68, pulse 88, temperature 98.5 F (36.9 C), temperature source Oral, resp. rate 19, height 5\' 4"  (1.626 m), weight 90.7 kg, SpO2 96%. Physical Exam  GENERAL: The patient is AO x3, in no acute distress. HEENT: Head is normocephalic and atraumatic. EOMI  are intact. Mouth is well hydrated and without lesions. NECK: Supple. No masses LUNGS: Clear to auscultation. No presence of rhonchi/wheezing/rales. Adequate chest expansion HEART: RRR, normal s1 and s2. ABDOMEN: Soft, nontender, no guarding, no peritoneal signs, and nondistended. BS +. No masses. EXTREMITIES: Without any cyanosis, clubbing, rash, lesions or edema. NEUROLOGIC: AOx3, no focal motor deficit. SKIN: no jaundice, no rashes  Assessment/Plan  67 year old female with past medical history of diabetes, gynecologic cancer, diabetes, coming for screening colonoscopy. The patient  is at average risk for colorectal cancer.  We will proceed with colonoscopy today.   Dolores Frame, MD 09/03/2023, 9:55 AM

## 2023-09-03 NOTE — Anesthesia Procedure Notes (Signed)
Date/Time: 09/03/2023 10:14 AM  Performed by: Julian Reil, CRNAPre-anesthesia Checklist: Patient identified, Suction available, Emergency Drugs available and Patient being monitored Patient Re-evaluated:Patient Re-evaluated prior to induction Oxygen Delivery Method: Nasal cannula Induction Type: IV induction Placement Confirmation: positive ETCO2

## 2023-09-03 NOTE — Discharge Instructions (Addendum)
You are being discharged to home.  Resume your previous diet.  We are waiting for your pathology results.  Your physician has recommended a repeat colonoscopy (date to be determined after pending pathology results are reviewed) for surveillance.

## 2023-09-03 NOTE — Transfer of Care (Signed)
Immediate Anesthesia Transfer of Care Note  Patient: Molly Weeks  Procedure(s) Performed: COLONOSCOPY WITH PROPOFOL POLYPECTOMY  Patient Location: Endoscopy Unit  Anesthesia Type:General  Level of Consciousness: awake, alert , and oriented  Airway & Oxygen Therapy: Patient Spontanous Breathing  Post-op Assessment: Report given to RN and Post -op Vital signs reviewed and stable  Post vital signs: Reviewed and stable  Last Vitals:  Vitals Value Taken Time  BP    Temp    Pulse    Resp    SpO2      Last Pain:  Vitals:   09/03/23 1009  TempSrc:   PainSc: 0-No pain      Patients Stated Pain Goal: 8 (09/03/23 0926)  Complications: No notable events documented.

## 2023-09-03 NOTE — Anesthesia Preprocedure Evaluation (Signed)
Anesthesia Evaluation  Patient identified by MRN, date of birth, ID band Patient awake    Reviewed: Allergy & Precautions, H&P , NPO status , Patient's Chart, lab work & pertinent test results, reviewed documented beta blocker date and time   Airway Mallampati: II  TM Distance: >3 FB Neck ROM: full    Dental no notable dental hx.    Pulmonary neg pulmonary ROS   Pulmonary exam normal breath sounds clear to auscultation       Cardiovascular Exercise Tolerance: Good negative cardio ROS  Rhythm:regular Rate:Normal     Neuro/Psych negative neurological ROS  negative psych ROS   GI/Hepatic negative GI ROS, Neg liver ROS,,,  Endo/Other  negative endocrine ROSdiabetes    Renal/GU negative Renal ROS  negative genitourinary   Musculoskeletal   Abdominal   Peds  Hematology negative hematology ROS (+)   Anesthesia Other Findings   Reproductive/Obstetrics negative OB ROS                             Anesthesia Physical Anesthesia Plan  ASA: 2  Anesthesia Plan: General   Post-op Pain Management:    Induction:   PONV Risk Score and Plan: Propofol infusion  Airway Management Planned:   Additional Equipment:   Intra-op Plan:   Post-operative Plan:   Informed Consent: I have reviewed the patients History and Physical, chart, labs and discussed the procedure including the risks, benefits and alternatives for the proposed anesthesia with the patient or authorized representative who has indicated his/her understanding and acceptance.     Dental Advisory Given  Plan Discussed with: CRNA  Anesthesia Plan Comments:        Anesthesia Quick Evaluation

## 2023-09-04 ENCOUNTER — Encounter (INDEPENDENT_AMBULATORY_CARE_PROVIDER_SITE_OTHER): Payer: Self-pay | Admitting: *Deleted

## 2023-09-04 LAB — SURGICAL PATHOLOGY

## 2023-09-04 NOTE — Anesthesia Postprocedure Evaluation (Signed)
Anesthesia Post Note  Patient: Molly Weeks  Procedure(s) Performed: COLONOSCOPY WITH PROPOFOL POLYPECTOMY  Patient location during evaluation: Phase II Anesthesia Type: General Level of consciousness: awake Pain management: pain level controlled Vital Signs Assessment: post-procedure vital signs reviewed and stable Respiratory status: spontaneous breathing and respiratory function stable Cardiovascular status: blood pressure returned to baseline and stable Postop Assessment: no headache and no apparent nausea or vomiting Anesthetic complications: no Comments: Late entry   No notable events documented.   Last Vitals:  Vitals:   09/03/23 1057 09/03/23 1101  BP: (!) 86/54 92/60  Pulse:    Resp:    Temp:    SpO2:      Last Pain:  Vitals:   09/03/23 1052  TempSrc: Oral  PainSc: 0-No pain                 Windell Norfolk

## 2023-09-09 ENCOUNTER — Encounter (INDEPENDENT_AMBULATORY_CARE_PROVIDER_SITE_OTHER): Payer: Self-pay | Admitting: *Deleted

## 2023-09-10 ENCOUNTER — Encounter (HOSPITAL_COMMUNITY): Payer: Self-pay | Admitting: Gastroenterology

## 2023-09-10 DIAGNOSIS — R7989 Other specified abnormal findings of blood chemistry: Secondary | ICD-10-CM | POA: Diagnosis not present

## 2023-12-25 ENCOUNTER — Encounter: Payer: Self-pay | Admitting: Obstetrics & Gynecology

## 2023-12-31 ENCOUNTER — Encounter: Payer: Self-pay | Admitting: Obstetrics & Gynecology

## 2023-12-31 ENCOUNTER — Inpatient Hospital Stay: Payer: PPO | Attending: Obstetrics & Gynecology | Admitting: Obstetrics & Gynecology

## 2023-12-31 VITALS — BP 162/84 | HR 76 | Temp 98.5°F | Resp 16 | Wt 203.6 lb

## 2023-12-31 DIAGNOSIS — Z8542 Personal history of malignant neoplasm of other parts of uterus: Secondary | ICD-10-CM | POA: Diagnosis not present

## 2023-12-31 DIAGNOSIS — C541 Malignant neoplasm of endometrium: Secondary | ICD-10-CM

## 2023-12-31 NOTE — Progress Notes (Signed)
Follow Up Note: Gyn-Onc  Osborne Casco 68 y.o. female  CC: She presents for a f/u visit   HPI: The oncology history was reviewed.  Interval History: She denies any vaginal bleeding, abdominal/pelvic pain, cough, lethargy or increasing abdominal girth. She has no concerns.  Review of Systems  Review of Systems  Constitutional:  Negative for malaise/fatigue and weight loss.  Respiratory:  Negative for shortness of breath and wheezing.   Cardiovascular:  Negative for chest pain and leg swelling.  Gastrointestinal:  Negative for abdominal pain, blood in stool, constipation, nausea and vomiting.  Genitourinary:  Negative for dysuria, frequency, hematuria and urgency.  Musculoskeletal:  Negative for joint pain and myalgias.  Neurological:  Negative for weakness.  Psychiatric/Behavioral:  Negative for depression. The patient does not have insomnia.    Current medications, allergy, social history, past surgical history, past medical history, family history were all reviewed.    Vitals:  BP (!) 162/84 (BP Location: Left Arm, Patient Position: Sitting) Comment: 1st BP 162/84 L side; 2nd Bp 169/67  Pulse 76   Temp 98.5 F (36.9 C) (Oral)   Resp 16   Wt 203 lb 9.6 oz (92.4 kg)   SpO2 99%   BMI 34.95 kg/m    Physical Exam:  Physical Exam Exam conducted with a chaperone present.  Constitutional:      General: She is not in acute distress. Cardiovascular:     Rate and Rhythm: Normal rate and regular rhythm.  Pulmonary:     Effort: Pulmonary effort is normal.     Breath sounds: Normal breath sounds. No wheezing or rhonchi.  Abdominal:     Palpations: Abdomen is soft.     Tenderness: There is no abdominal tenderness. There is no right CVA tenderness or left CVA tenderness.     Hernia: No hernia is present.  Genitourinary:    General: Normal vulva.     Urethra: No urethral lesion.     Vagina: No lesions. No bleeding Musculoskeletal:     Cervical back: Neck supple.     Right  lower leg: No edema.     Left lower leg: No edema.  Lymphadenopathy:     Upper Body:     Right upper body: No supraclavicular adenopathy.     Left upper body: No supraclavicular adenopathy.     Lower Body: No right inguinal adenopathy. No left inguinal adenopathy.  Skin:    Findings: No rash.  Neurological:     Mental Status: She is oriented to person, place, and time.   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  Negative symptom review, normal exam.  No evidence of recurrence   >recommend she have follow-up every 6 months for 5 years in accordance with NCCN guidelines.  Alternate visits /generalist gynecologist   I personally spent 25 minutes face-to-face and non-face-to-face in the care of this patient, which includes all pre, intra, and post visit time on the date of service.    Antionette Char, MD

## 2023-12-31 NOTE — Assessment & Plan Note (Signed)
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  Negative symptom review, normal exam.  No evidence of recurrence   >recommend she have follow-up every 6 months for 5 years in accordance with NCCN guidelines.  Alternate visits /generalist gynecologist

## 2023-12-31 NOTE — Patient Instructions (Signed)
Return in 1 year ?

## 2024-03-22 ENCOUNTER — Other Ambulatory Visit (HOSPITAL_COMMUNITY): Payer: Self-pay | Admitting: Adult Health

## 2024-03-22 DIAGNOSIS — Z1231 Encounter for screening mammogram for malignant neoplasm of breast: Secondary | ICD-10-CM

## 2024-05-03 ENCOUNTER — Ambulatory Visit (HOSPITAL_COMMUNITY)
Admission: RE | Admit: 2024-05-03 | Discharge: 2024-05-03 | Disposition: A | Discharge status: Home / Self Care | Source: Ambulatory Visit | Attending: Adult Health | Admitting: Adult Health

## 2024-05-03 DIAGNOSIS — Z1231 Encounter for screening mammogram for malignant neoplasm of breast: Secondary | ICD-10-CM | POA: Diagnosis present

## 2024-05-06 ENCOUNTER — Ambulatory Visit: Payer: Self-pay | Admitting: Adult Health

## 2024-07-07 NOTE — Progress Notes (Signed)
  Medicare AWV  Molly Weeks is a 68 y.o. female who presents for her Welcome to Medicare visit.  Clinical documentation was reviewed and is accessible via encounter-level attachments.    Any physical exam components or additional concerns beyond the scope of the Annual Wellness Visit may be documented in a separate note within this encounter.  Medicare Required Components     Reviewed and updated this visit by provider: Tobacco       Substance Use Disorder Risk Statement: Low Substance Use Disorder / Overdose risk - Risk factors were reviewed and patient is low risk   Patient Care Team: Charmaine Heller, NP as PCP - General (Family Medicine)  Vitals   Vitals:   07/07/24 0746  BP: (!) 156/65  Patient Position: Sitting  Pulse: 66  Temp: 97.6 F (36.4 C)  TempSrc: Temporal  Resp: 16  Height: 5' 4 (1.626 m)  Weight: 191 lb (86.6 kg)  SpO2: 98%  BMI (Calculated): 32.8  PainSc: 0-No pain    Disposition   1. Welcome to Medicare preventive visit (Primary) -     Welcome to Medicare EKG 2. Encounter for screening for cardiovascular disorders 3. Annual physical exam 4. Type 2 diabetes mellitus with hyperglycemia, without long-term current use of insulin (*) -     POCT ACR URINE -     POCT Hemoglobin A1C -     CBC And Differential; Future -     Comprehensive Metabolic Panel; Future 5. Vitamin D  deficiency -     Vitamin D  25 Hydroxy; Future 6. Mixed hyperlipidemia -     Lipid Panel With LDL/HDL Ratio; Future 7. Medication management -     TSH; Future   No follow-ups on file.   Health maintenance issues were discussed with the patient.  A written plan was provided to the patient in the form of patient instructions in the After Visit Summary document.

## 2024-07-07 NOTE — Progress Notes (Signed)
 Subjective   Patient ID:  Molly Weeks is a 68 y.o. (DOB 07/23/1956) female    Patient presents with  . Annual Exam     HPI  Molly Weeks is here today for her welcome to Medicare visit and routine physical.  She overall is doing really well.  She does complain of intermittent left hip discomfort.  Usually when she goes from sitting to standing she can feel tightness.  If she moves the hip or start walking it gets better.  She is not ready to see an orthopedic at this time.  She did have her oncology checkup which was good.  She had a colonoscopy in June which did show 3 benign polyps.  She currently is not taking any prescription medications.   Reviewed and updated this visit by provider: Tobacco        Review of Systems  Constitutional: Negative.   HENT: Negative.    Respiratory: Negative.    Cardiovascular: Negative.   Musculoskeletal:  Positive for arthralgias.  Neurological: Negative.   Psychiatric/Behavioral: Negative.       Objective   Vitals:   07/07/24 0746 07/07/24 0824  BP: (!) 156/65 132/70  Patient Position: Sitting   Pulse: 66   Temp: 97.6 F (36.4 C)   TempSrc: Temporal   Resp: 16   Height: 5' 4 (1.626 m)   Weight: 191 lb (86.6 kg)   SpO2: 98%   BMI (Calculated): 32.8   PainSc: 0-No pain      Physical Exam Constitutional:      Appearance: Normal appearance. She is obese.  HENT:     Head: Normocephalic.     Right Ear: Tympanic membrane, ear canal and external ear normal.     Left Ear: Tympanic membrane, ear canal and external ear normal.     Nose: Nose normal.     Mouth/Throat:     Mouth: Mucous membranes are moist.     Pharynx: Oropharynx is clear.  Eyes:     Extraocular Movements: Extraocular movements intact.     Conjunctiva/sclera: Conjunctivae normal.     Pupils: Pupils are equal, round, and reactive to light.  Cardiovascular:     Rate and Rhythm: Normal rate and regular rhythm.     Pulses: Normal pulses.  Musculoskeletal:         General: Normal range of motion.     Cervical back: Normal range of motion.  Pulmonary:     Effort: Pulmonary effort is normal.     Breath sounds: Normal breath sounds.  Abdominal:     General: Abdomen is flat. Bowel sounds are normal.     Palpations: Abdomen is soft.  Skin:    General: Skin is warm and dry.     Capillary Refill: Capillary refill takes less than 2 seconds.  Neurological:     General: No focal deficit present.     Mental Status: She is alert and oriented to person, place, and time. Mental status is at baseline.  Psychiatric:        Mood and Affect: Mood normal.        Behavior: Behavior normal.        Thought Content: Thought content normal.        Judgment: Judgment normal.       Assessment and Plan  1. Welcome to Medicare preventive visit (Primary) -     Welcome to Medicare EKG 2. Encounter for screening for cardiovascular disorders 3. Annual physical exam 4. Type 2 diabetes mellitus with  hyperglycemia, without long-term current use of insulin (*) -     POCT ACR URINE -     POCT Hemoglobin A1C -     CBC And Differential; Future -     Comprehensive Metabolic Panel; Future 5. Vitamin D  deficiency -     Vitamin D  25 Hydroxy; Future 6. Mixed hyperlipidemia -     Lipid Panel With LDL/HDL Ratio; Future 7. Medication management -     TSH; Future 8. Left hip pain 9. 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 Suggested healthier eating habits: decrease carbohydrates, sugars and starches. Increase lean meats and green vegetables. Try to exercise 20-30 minutes 4 times a week.       Labs today, follow up with results EKG normal  No follow-ups on file.     - Health maintenance issues including appropriate cancer screening, healthy diet, exercise and tobacco avoidance were discussed with the patient.  I've encouraged healthy lifestyle  modifications of eating fruits/vegetables, decreased fat intake, regular daily exercise, and decrease stress.  - Risks, benefits, and alternatives of the medications and treatment plan prescribed today were discussed, and patient expressed understanding.  - Labs ordered today:  We will call pt with results. - Discussed exercising 30 minutes at least 5 days per week and eating healthy, consistent of fruits, vegetables, and lean meats. - Follow up in about 1 year for annual physical exam, Chronic medical problems. - Return to clinic to be reevaluated if symptoms worsen, persist, change, or if you have any other concerns. - I discussed this diagnosis with the patient and discussed the treatment plan with them. This treatment plan is also outlined in the Patient Instructions and a copy of this was provided to the patient.   Patient's Medications  New Prescriptions   No medications on file  Previous Medications   CHOLECALCIFEROL (VITAMIN D -1000 MAX ST) 25 MCG (1000 UT) TABLET    Take two tablets (2,000 Units dose) by mouth daily.  Modified Medications   No medications on file  Discontinued Medications   No medications on file      Risks, benefits, and alternatives of the medications and treatment plan prescribed today were discussed, and patient expressed understanding. Plan follow-up as discussed or as needed if any worsening symptoms or change in condition.   A yearly preventative health exam was recommended and current age based recommendations were discussed.  I have reviewed the information contained in this note and personally verified its accuracy.  MDM billing - I personally developed the plan of care based on documented medical decision making. Charmaine Heller, NP

## 2024-07-21 ENCOUNTER — Encounter: Payer: Self-pay | Admitting: Adult Health

## 2024-07-21 ENCOUNTER — Ambulatory Visit: Admitting: Adult Health

## 2024-07-21 VITALS — BP 162/90 | HR 69 | Ht 64.0 in | Wt 190.5 lb

## 2024-07-21 DIAGNOSIS — R030 Elevated blood-pressure reading, without diagnosis of hypertension: Secondary | ICD-10-CM

## 2024-07-21 DIAGNOSIS — Z90710 Acquired absence of both cervix and uterus: Secondary | ICD-10-CM

## 2024-07-21 DIAGNOSIS — Z01419 Encounter for gynecological examination (general) (routine) without abnormal findings: Secondary | ICD-10-CM | POA: Diagnosis not present

## 2024-07-21 DIAGNOSIS — C541 Malignant neoplasm of endometrium: Secondary | ICD-10-CM | POA: Diagnosis not present

## 2024-07-21 DIAGNOSIS — Z1211 Encounter for screening for malignant neoplasm of colon: Secondary | ICD-10-CM

## 2024-07-21 DIAGNOSIS — Z90721 Acquired absence of ovaries, unilateral: Secondary | ICD-10-CM

## 2024-07-21 LAB — HEMOCCULT GUIAC POC 1CARD (OFFICE): Fecal Occult Blood, POC: NEGATIVE

## 2024-07-21 NOTE — Progress Notes (Signed)
 Patient ID: Molly Weeks, female   DOB: Dec 25, 1955, 68 y.o.   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 She will get an exam every 66 months for 5 years, and this is her well woman gyn exam physical and 6 month check. She had a physical  and labs with PCP 07/07/24.  PCP is JAYSON Heller NP   Current Medications, Allergies, Past Medical History, Past Surgical History, Family History and Social History were reviewed in [employer] record.     Review of Systems: Patient denies any headaches, hearing loss, fatigue, blurred vision, shortness of breath, chest pain, abdominal pain, problems with bowel movements, urination, or intercourse(not active). No joint pain or mood swings.  Denies any vaginal bleeding    Physical Exam:BP (!) 162/90 (BP Location: Left Arm, Patient Position: Sitting, Cuff Size: Normal)   Pulse 69   Ht 5' 4 (1.626 m)   Wt 190 lb 8 oz (86.4 kg)   BMI 32.70 kg/m   General:  Well developed, well nourished, no acute distress Skin:  Warm and dry Neck:  Midline trachea, normal thyroid , good ROM, no lymphadenopathy, no carotid bruits heard  Lungs; Clear to auscultation bilaterally Breast:  No dominant palpable mass, retraction, or nipple discharge Cardiovascular: Regular rate and rhythm Abdomen:  Soft, non tender, no hepatosplenomegaly Pelvic:  External genitalia is normal in appearance, no lesions.  The vagina is pale, no lesions. Urethra has no lesions or masses. The cervix and uterus are absent.  No adnexal masses or tenderness noted.Bladder is non tender, no masses felt. Rectal: Good sphincter tone, no polyps, or hemorrhoids felt.  Hemoccult negative. Extremities/musculoskeletal:  No swelling or varicosities noted, no clubbing or cyanosis Psych:  No mood changes,  alert and cooperative,seems happy AA is 0 Fall risk is low    07/21/2024   10:33 AM 07/08/2023    8:39 AM 07/02/2022    1:35 PM  Depression screen PHQ 2/9  Decreased Interest 0 0 0  Down, Depressed, Hopeless 0 0 0  PHQ - 2 Score 0 0 0  Altered sleeping 0 0 0  Tired, decreased energy 0 0 0  Change in appetite 0 0 0  Feeling bad or failure about yourself  0 0 0  Trouble concentrating 0 0 0  Moving slowly or fidgety/restless 0 0 0  Suicidal thoughts 0 0 0  PHQ-9 Score 0 0 0       07/21/2024   10:33 AM 07/08/2023    8:39 AM 07/02/2022    1:35 PM 06/25/2021    8:50 AM  GAD 7 : Generalized Anxiety Score  Nervous, Anxious, on Edge 0 0 0 0  Control/stop worrying 0 0 0 0  Worry too much - different things 0 0 0 0  Trouble relaxing 0 0 0 0  Restless 0 0 0 0  Easily annoyed or irritable 0 0 0 0  Afraid - awful might happen 0 0 0 0  Total GAD 7 Score 0 0 0 0    Upstream - 07/21/24 1030       Pregnancy Intention Screening   Does the patient want to become pregnant in the next year? N/A    Does the patient's partner want to become pregnant in the next year? N/A    Would the patient like to discuss  contraceptive options today? N/A      Contraception Wrap Up   Current Method Female Sterilization   hyst   End Method Female Sterilization   hyst   Contraception Counseling Provided No         Examination chaperoned by Clarita Salt LPN   Impression and plan: 1. Well woman exam with routine gynecological exam (Primary) Physical in 1 year Labs with PCP Mammogram was negative 05/03/24 Colonoscopy per GI   2. Endometrial cancer [facility]) Follow up with Dr Rogelio in February or March 2026  3. S/P hysterectomy with oophorectomy  4. Encounter for screening fecal occult blood testing Hemoccult was negative  - POCT occult blood stool  5. Elevated BP without diagnosis of hypertension Keep check on BP and follow up with PCP

## 2024-09-01 ENCOUNTER — Encounter (INDEPENDENT_AMBULATORY_CARE_PROVIDER_SITE_OTHER): Payer: Self-pay | Admitting: Gastroenterology

## 2024-12-29 ENCOUNTER — Inpatient Hospital Stay: Admitting: Obstetrics & Gynecology

## 2024-12-29 DIAGNOSIS — C541 Malignant neoplasm of endometrium: Secondary | ICD-10-CM
